# Patient Record
Sex: Female | Born: 1985 | Race: Black or African American | Hispanic: No | Marital: Single | Smoking: Never smoker
Health system: Southern US, Community
[De-identification: ages and names within clinical notes are randomized; demographics above are authoritative.]

## PROBLEM LIST (undated history)

## (undated) DIAGNOSIS — D649 Anemia, unspecified: Secondary | ICD-10-CM

## (undated) DIAGNOSIS — E559 Vitamin D deficiency, unspecified: Secondary | ICD-10-CM

## (undated) DIAGNOSIS — D219 Benign neoplasm of connective and other soft tissue, unspecified: Secondary | ICD-10-CM

## (undated) DIAGNOSIS — F419 Anxiety disorder, unspecified: Secondary | ICD-10-CM

## (undated) DIAGNOSIS — M7989 Other specified soft tissue disorders: Secondary | ICD-10-CM

## (undated) DIAGNOSIS — E739 Lactose intolerance, unspecified: Secondary | ICD-10-CM

## (undated) DIAGNOSIS — Z889 Allergy status to unspecified drugs, medicaments and biological substances status: Secondary | ICD-10-CM

## (undated) DIAGNOSIS — K219 Gastro-esophageal reflux disease without esophagitis: Secondary | ICD-10-CM

## (undated) DIAGNOSIS — K589 Irritable bowel syndrome without diarrhea: Secondary | ICD-10-CM

## (undated) HISTORY — DX: Other specified soft tissue disorders: M79.89

## (undated) HISTORY — DX: Lactose intolerance, unspecified: E73.9

## (undated) HISTORY — DX: Benign neoplasm of connective and other soft tissue, unspecified: D21.9

## (undated) HISTORY — PX: UPPER GI ENDOSCOPY: SHX6162

## (undated) HISTORY — PX: APPENDECTOMY: SHX54

## (undated) HISTORY — DX: Vitamin D deficiency, unspecified: E55.9

---

## 2010-10-30 ENCOUNTER — Emergency Department (INDEPENDENT_AMBULATORY_CARE_PROVIDER_SITE_OTHER): Payer: No Typology Code available for payment source

## 2010-10-30 ENCOUNTER — Emergency Department (HOSPITAL_BASED_OUTPATIENT_CLINIC_OR_DEPARTMENT_OTHER)
Admission: EM | Admit: 2010-10-30 | Discharge: 2010-10-30 | Disposition: A | Discharge status: Home / Self Care | Payer: No Typology Code available for payment source | Attending: Emergency Medicine | Admitting: Emergency Medicine

## 2010-10-30 DIAGNOSIS — S6000XA Contusion of unspecified finger without damage to nail, initial encounter: Secondary | ICD-10-CM | POA: Insufficient documentation

## 2010-10-30 DIAGNOSIS — I10 Essential (primary) hypertension: Secondary | ICD-10-CM | POA: Insufficient documentation

## 2010-10-30 DIAGNOSIS — R0789 Other chest pain: Secondary | ICD-10-CM

## 2010-10-30 DIAGNOSIS — S40019A Contusion of unspecified shoulder, initial encounter: Secondary | ICD-10-CM | POA: Insufficient documentation

## 2010-10-30 DIAGNOSIS — Y92410 Unspecified street and highway as the place of occurrence of the external cause: Secondary | ICD-10-CM | POA: Insufficient documentation

## 2010-10-30 DIAGNOSIS — M79609 Pain in unspecified limb: Secondary | ICD-10-CM
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
## 2017-10-26 ENCOUNTER — Other Ambulatory Visit: Payer: Self-pay | Admitting: Obstetrics and Gynecology

## 2017-11-22 DIAGNOSIS — N83209 Unspecified ovarian cyst, unspecified side: Secondary | ICD-10-CM | POA: Diagnosis present

## 2017-11-22 DIAGNOSIS — D259 Leiomyoma of uterus, unspecified: Secondary | ICD-10-CM | POA: Diagnosis present

## 2017-11-22 NOTE — H&P (Addendum)
Yolanda Meadows is a 32 y.o. female, G:0 who presents for an abdominal myomectomy because of very large fibroids and heavy menses.  The patient reports that for a year and a half,  she has noticed that she is unable to sleep on her stomach due to discomfort.  Additionally she has noticed an increase in menstrual flow with clots.  Though she only uses #2 overnight pads a day,  they are saturated when she changes them. Cramping accompanies her 7 day flow and is rated 9.5/10 on a 10 point pain scale.  Fortunately they decrease to 2/10  with #2 OTC Aleve.  She goes on to report that she has loose stools with her menstrual flow but no intermenstrual bleeding, changes in bladder function and is celibate.  An ultrasound in April 2019 revealed:  a rretroflexed uterus: 14.3 x 5.0 x 5.8 cm, endometrium-12 mm; #2 pedunculated fibroids: left lateral-11.4 cm and right fundal-18.4 cm; right ovary-3.9 cm and left ovary- with a complex cystic mass-10 cm (? dermoid).  On Oct 11, 2017 the patient was given Lupron Depot 11.25 and since that time she has had spotting and light bleeding along with severe hot flashes and headache (to see primary care physician regarding headaches).  A review of  both medical and surgical management options were given to the patient  however, given the size of her fibroids and related symptoms,  she has chosen to have them removed surgically along with the cyst on her ovary.  Past Medical History  OB History: G:0  GYN History: menarche: 32 YO    LMP: 10/25/2017    Contracepton: Celibate  Denies history of abnormal PAP smear.   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  ROS: Admits to glasses, daily headaches (attributed to Lupron-seeing PCP), chronic sinus issues, left ankle swelling due to pes planus and right wrist tendinitis;    Denies  vision changes,  dysphagia, tinnitus, dizziness, hoarseness, cough,  chest pain, shortness of breath, nausea, vomiting, diarrhea,constipation,  urinary frequency, urgency  dysuria, hematuria, vaginitis symptoms, pelvic pain, swelling of joints,easy bruising,  myalgias, arthralgias, skin rashes, unexplained weight loss and except as is mentioned in the history of present illness, patient's review of systems is otherwise negative.     Physical Exam  Bp: 102/60  P: 67 bpm  R: 16  Temperature: 97.9 degree F orally  Weight: 230 lbs.  Height: 5'3"  BMI: 40.7    O2Sat. 99%  (room air)  Neck: supple without masses or thyromegaly Lungs: clear to auscultation Heart: regular rate and rhythm Abdomen: firm mass from pelvis to level of xiphoid, non-tender and no organomegaly Pelvic:EGBUS- wnl; vagina-normal rugae; uterus-extends to level of the xiphoid at its largest dimension, cervix without lesions or motion tenderness; adnexae-no tenderness or masses Extremities:  no clubbing, cyanosis or edema   Assesment:  Large  Symptomatic Uterine Fibroids                       Large Ovarian Cyst (?  Dermoid)   Disposition:  Reviewed the risks of surgery to include, but not limited to: reaction to anesthesia, damage to adjacent organs, infection and excessive bleeding. The patient verbalized understanding of these risks and has consented to proceed with an Abdominal Myomectomy with Ovarian Cystectomy at [place] on December 07, 2017 at noon.  CSN# [id number]   [place] J. Florene Glen, PA-C  for Dr Gwynneth Munson A.  Amberle Lyter.  Date of Initial H&P: 11/22/17  History reviewed, patient examined, no change in status, stable for surgery.

## 2017-11-30 NOTE — Patient Instructions (Addendum)
Your procedure is scheduled on: Thursday, June 27  Enter through the Main Entrance of [facility] at: 10:30 am  Pick up the phone at the desk and dial [phone].  Call this number if you have problems the morning of surgery: [phone].  Remember: Do NOT eat food or Do NOT drink clear liquids (including water) after midnight Wednesday.  Take these medicines the morning of surgery with a SIP OF WATER: None.  May use flonase if needed  Stop herbal medications, vitamin supplements and Ibuprofen/NSAIDS at this time.  Do NOT wear jewelry (body piercing), metal hair clips/bobby pins, make-up, or nail polish. Do NOT wear lotions, powders, or perfumes.  You may wear deoderant. Do NOT shave for 48 hours prior to surgery. Do NOT bring valuables to the hospital.  Leave suitcase in car.  After surgery it may be brought to your room.  For patients admitted to the hospital, checkout time is 11:00 AM the day of discharge. Home with Yolanda Meadows cell [phone].

## 2017-12-06 ENCOUNTER — Other Ambulatory Visit: Payer: Self-pay

## 2017-12-06 ENCOUNTER — Encounter (HOSPITAL_COMMUNITY): Payer: Self-pay

## 2017-12-06 ENCOUNTER — Encounter (HOSPITAL_COMMUNITY)
Admission: RE | Admit: 2017-12-06 | Discharge: 2017-12-06 | Disposition: A | Discharge status: Home / Self Care | Payer: BC Managed Care – PPO | Source: Ambulatory Visit | Attending: Obstetrics and Gynecology | Admitting: Obstetrics and Gynecology

## 2017-12-06 DIAGNOSIS — Z01812 Encounter for preprocedural laboratory examination: Secondary | ICD-10-CM

## 2017-12-06 HISTORY — DX: Irritable bowel syndrome, unspecified: K58.9

## 2017-12-06 HISTORY — DX: Gastro-esophageal reflux disease without esophagitis: K21.9

## 2017-12-06 HISTORY — DX: Allergy status to unspecified drugs, medicaments and biological substances: Z88.9

## 2017-12-06 HISTORY — DX: Anxiety disorder, unspecified: F41.9

## 2017-12-06 HISTORY — DX: Anemia, unspecified: D64.9

## 2017-12-06 LAB — CBC
HCT: 43.6 % (ref 36.0–46.0)
Hemoglobin: 14.1 g/dL (ref 12.0–15.0)
MCH: 26.4 pg (ref 26.0–34.0)
MCHC: 32.3 g/dL (ref 30.0–36.0)
MCV: 81.5 fL (ref 78.0–100.0)
PLATELETS: 260 10*3/uL (ref 150–400)
RBC: 5.35 MIL/uL — AB (ref 3.87–5.11)
RDW: 13.2 % (ref 11.5–15.5)
WBC: 3.7 10*3/uL — ABNORMAL LOW (ref 4.0–10.5)

## 2017-12-06 LAB — BASIC METABOLIC PANEL
Anion gap: 7 (ref 5–15)
BUN: 16 mg/dL (ref 6–20)
CO2: 27 mmol/L (ref 22–32)
CREATININE: 1.09 mg/dL — AB (ref 0.44–1.00)
Calcium: 9.3 mg/dL (ref 8.9–10.3)
Chloride: 103 mmol/L (ref 98–111)
Glucose, Bld: 84 mg/dL (ref 70–99)
Potassium: 5 mmol/L (ref 3.5–5.1)
Sodium: 137 mmol/L (ref 135–145)

## 2017-12-06 LAB — TYPE AND SCREEN
ABO/RH(D): A POS
Antibody Screen: NEGATIVE

## 2017-12-06 LAB — ABO/RH: ABO/RH(D): A POS

## 2017-12-07 ENCOUNTER — Encounter (HOSPITAL_COMMUNITY)
Admission: AD | Disposition: A | Discharge status: Home / Self Care | Payer: Self-pay | Source: Home / Self Care | Attending: Obstetrics and Gynecology

## 2017-12-07 ENCOUNTER — Inpatient Hospital Stay (HOSPITAL_COMMUNITY): Payer: BC Managed Care – PPO | Admitting: Certified Registered Nurse Anesthetist

## 2017-12-07 ENCOUNTER — Encounter (HOSPITAL_COMMUNITY): Payer: Self-pay | Admitting: Anesthesiology

## 2017-12-07 ENCOUNTER — Other Ambulatory Visit: Payer: Self-pay

## 2017-12-07 ENCOUNTER — Inpatient Hospital Stay (HOSPITAL_COMMUNITY)
Admission: AD | Admit: 2017-12-07 | Discharge: 2017-12-09 | DRG: 742 | Disposition: A | Discharge status: Home / Self Care | Payer: BC Managed Care – PPO | Attending: Obstetrics and Gynecology | Admitting: Obstetrics and Gynecology

## 2017-12-07 DIAGNOSIS — D259 Leiomyoma of uterus, unspecified: Secondary | ICD-10-CM | POA: Diagnosis present

## 2017-12-07 DIAGNOSIS — Z6841 Body Mass Index (BMI) 40.0 and over, adult: Secondary | ICD-10-CM

## 2017-12-07 DIAGNOSIS — L918 Other hypertrophic disorders of the skin: Secondary | ICD-10-CM | POA: Diagnosis present

## 2017-12-07 DIAGNOSIS — D271 Benign neoplasm of left ovary: Secondary | ICD-10-CM | POA: Diagnosis present

## 2017-12-07 DIAGNOSIS — N83209 Unspecified ovarian cyst, unspecified side: Secondary | ICD-10-CM | POA: Diagnosis present

## 2017-12-07 DIAGNOSIS — D219 Benign neoplasm of connective and other soft tissue, unspecified: Secondary | ICD-10-CM | POA: Diagnosis present

## 2017-12-07 HISTORY — PX: EXCISION OF SKIN TAG: SHX6270

## 2017-12-07 HISTORY — PX: OVARIAN CYST REMOVAL: SHX89

## 2017-12-07 HISTORY — PX: MYOMECTOMY: SHX85

## 2017-12-07 LAB — PREGNANCY, URINE: PREG TEST UR: NEGATIVE

## 2017-12-07 SURGERY — MYOMECTOMY, ABDOMINAL APPROACH
Anesthesia: General | Site: Abdomen

## 2017-12-07 MED ORDER — LACTATED RINGERS IV SOLN
INTRAVENOUS | Status: DC
  Administered 2017-12-07: 13:00:00 via INTRAVENOUS
  Administered 2017-12-07: 125 mL/h via INTRAVENOUS

## 2017-12-07 MED ORDER — DOCUSATE SODIUM 100 MG PO CAPS
100.0000 mg | ORAL_CAPSULE | Freq: Two times a day (BID) | ORAL | Status: DC
  Administered 2017-12-07 – 2017-12-09 (×4): 100 mg via ORAL
  Filled 2017-12-07 (×4): qty 1

## 2017-12-07 MED ORDER — CEFAZOLIN SODIUM-DEXTROSE 2-4 GM/100ML-% IV SOLN
INTRAVENOUS | Status: AC
  Filled 2017-12-07: qty 100

## 2017-12-07 MED ORDER — HYDROMORPHONE HCL 1 MG/ML IJ SOLN
0.2500 mg | INTRAMUSCULAR | Status: DC | PRN
  Administered 2017-12-07 (×4): 0.5 mg via INTRAVENOUS

## 2017-12-07 MED ORDER — MIDAZOLAM HCL 5 MG/5ML IJ SOLN
INTRAMUSCULAR | Status: DC | PRN
  Administered 2017-12-07: 2 mg via INTRAVENOUS

## 2017-12-07 MED ORDER — SUGAMMADEX SODIUM 200 MG/2ML IV SOLN
INTRAVENOUS | Status: DC | PRN
  Administered 2017-12-07: 200 mg via INTRAVENOUS

## 2017-12-07 MED ORDER — 0.9 % SODIUM CHLORIDE (POUR BTL) OPTIME
TOPICAL | Status: DC | PRN
  Administered 2017-12-07 (×5): 1000 mL

## 2017-12-07 MED ORDER — SODIUM CHLORIDE 0.9 % IJ SOLN
INTRAMUSCULAR | Status: AC
  Filled 2017-12-07: qty 100

## 2017-12-07 MED ORDER — NALOXONE HCL 0.4 MG/ML IJ SOLN: 0.4000 mg | INTRAMUSCULAR | Status: DC | PRN

## 2017-12-07 MED ORDER — MIDAZOLAM HCL 2 MG/2ML IJ SOLN
INTRAMUSCULAR | Status: AC
  Filled 2017-12-07: qty 2

## 2017-12-07 MED ORDER — HYDROMORPHONE HCL 1 MG/ML IJ SOLN
INTRAMUSCULAR | Status: AC
  Filled 2017-12-07: qty 1

## 2017-12-07 MED ORDER — HYDROMORPHONE 1 MG/ML IV SOLN
INTRAVENOUS | Status: DC
  Administered 2017-12-07: 17:00:00 via INTRAVENOUS
  Administered 2017-12-07: 3.9 mg via INTRAVENOUS
  Administered 2017-12-08: 1.5 mg via INTRAVENOUS
  Administered 2017-12-08 (×2): 0.9 mg via INTRAVENOUS
  Filled 2017-12-07: qty 25

## 2017-12-07 MED ORDER — DIPHENHYDRAMINE HCL 12.5 MG/5ML PO ELIX: 12.5000 mg | ORAL_SOLUTION | Freq: Four times a day (QID) | ORAL | Status: DC | PRN

## 2017-12-07 MED ORDER — KETOROLAC TROMETHAMINE 30 MG/ML IJ SOLN
INTRAMUSCULAR | Status: DC | PRN
  Administered 2017-12-07: 30 mg via INTRAVENOUS

## 2017-12-07 MED ORDER — DIPHENHYDRAMINE HCL 50 MG/ML IJ SOLN: 12.5000 mg | Freq: Four times a day (QID) | INTRAMUSCULAR | Status: DC | PRN

## 2017-12-07 MED ORDER — SCOPOLAMINE 1 MG/3DAYS TD PT72
1.0000 | MEDICATED_PATCH | Freq: Once | TRANSDERMAL | Status: DC
  Administered 2017-12-07: 1.5 mg via TRANSDERMAL

## 2017-12-07 MED ORDER — FENTANYL CITRATE (PF) 250 MCG/5ML IJ SOLN
INTRAMUSCULAR | Status: AC
  Filled 2017-12-07: qty 5

## 2017-12-07 MED ORDER — MENTHOL 3 MG MT LOZG
1.0000 | LOZENGE | OROMUCOSAL | Status: DC | PRN
Start: 2017-12-07 — End: 2017-12-09

## 2017-12-07 MED ORDER — CEFAZOLIN SODIUM-DEXTROSE 2-4 GM/100ML-% IV SOLN
2.0000 g | INTRAVENOUS | Status: AC
  Administered 2017-12-07: 2 g via INTRAVENOUS

## 2017-12-07 MED ORDER — DEXAMETHASONE SODIUM PHOSPHATE 4 MG/ML IJ SOLN
INTRAMUSCULAR | Status: DC | PRN
  Administered 2017-12-07: 4 mg via INTRAVENOUS

## 2017-12-07 MED ORDER — OXYCODONE-ACETAMINOPHEN 5-325 MG PO TABS
1.0000 | ORAL_TABLET | Freq: Four times a day (QID) | ORAL | Status: DC | PRN
  Administered 2017-12-08 (×2): 1 via ORAL
  Filled 2017-12-07: qty 2
  Filled 2017-12-07 (×2): qty 1

## 2017-12-07 MED ORDER — ROCURONIUM BROMIDE 100 MG/10ML IV SOLN
INTRAVENOUS | Status: DC | PRN
  Administered 2017-12-07: 50 mg via INTRAVENOUS
  Administered 2017-12-07 (×2): 10 mg via INTRAVENOUS

## 2017-12-07 MED ORDER — PROPOFOL 10 MG/ML IV BOLUS
INTRAVENOUS | Status: DC | PRN
  Administered 2017-12-07: 200 mg via INTRAVENOUS
  Administered 2017-12-07: 10 mg via INTRAVENOUS

## 2017-12-07 MED ORDER — IBUPROFEN 600 MG PO TABS: 600.0000 mg | ORAL_TABLET | Freq: Four times a day (QID) | ORAL | Status: DC | PRN

## 2017-12-07 MED ORDER — ONDANSETRON HCL 4 MG PO TABS: 4.0000 mg | ORAL_TABLET | Freq: Three times a day (TID) | ORAL | Status: DC | PRN

## 2017-12-07 MED ORDER — FENTANYL CITRATE (PF) 100 MCG/2ML IJ SOLN
INTRAMUSCULAR | Status: DC | PRN
  Administered 2017-12-07 (×3): 50 ug via INTRAVENOUS
  Administered 2017-12-07 (×2): 100 ug via INTRAVENOUS
  Administered 2017-12-07: 50 ug via INTRAVENOUS

## 2017-12-07 MED ORDER — FENTANYL CITRATE (PF) 100 MCG/2ML IJ SOLN
INTRAMUSCULAR | Status: AC
  Filled 2017-12-07: qty 2

## 2017-12-07 MED ORDER — SODIUM CHLORIDE 0.9% FLUSH: 9.0000 mL | INTRAVENOUS | Status: DC | PRN

## 2017-12-07 MED ORDER — LACTATED RINGERS IV SOLN
INTRAVENOUS | Status: DC
  Administered 2017-12-07 – 2017-12-08 (×2): via INTRAVENOUS

## 2017-12-07 MED ORDER — FERROUS SULFATE 325 (65 FE) MG PO TABS
325.0000 mg | ORAL_TABLET | Freq: Every day | ORAL | Status: DC
  Administered 2017-12-07 – 2017-12-08 (×2): 325 mg via ORAL
  Filled 2017-12-07 (×2): qty 1

## 2017-12-07 MED ORDER — ONDANSETRON HCL 4 MG/2ML IJ SOLN
INTRAMUSCULAR | Status: DC | PRN
  Administered 2017-12-07: 4 mg via INTRAVENOUS

## 2017-12-07 MED ORDER — ONDANSETRON HCL 4 MG/2ML IJ SOLN: 4.0000 mg | Freq: Four times a day (QID) | INTRAMUSCULAR | Status: DC | PRN

## 2017-12-07 MED ORDER — LIDOCAINE HCL (CARDIAC) PF 100 MG/5ML IV SOSY
PREFILLED_SYRINGE | INTRAVENOUS | Status: DC | PRN
  Administered 2017-12-07: 100 mg via INTRAVENOUS

## 2017-12-07 MED ORDER — VASOPRESSIN 20 UNIT/ML IV SOLN
INTRAVENOUS | Status: AC
  Filled 2017-12-07: qty 1

## 2017-12-07 MED ORDER — SCOPOLAMINE 1 MG/3DAYS TD PT72
MEDICATED_PATCH | TRANSDERMAL | Status: AC
  Administered 2017-12-07: 1.5 mg via TRANSDERMAL
  Filled 2017-12-07: qty 1

## 2017-12-07 MED ORDER — KETOROLAC TROMETHAMINE 30 MG/ML IJ SOLN
30.0000 mg | Freq: Four times a day (QID) | INTRAMUSCULAR | Status: DC
Start: 2017-12-07 — End: 2017-12-08
  Administered 2017-12-07 – 2017-12-08 (×4): 30 mg via INTRAVENOUS
  Filled 2017-12-07 (×4): qty 1

## 2017-12-07 SURGICAL SUPPLY — 40 items
BARRIER ADHS 3X4 INTERCEED (GAUZE/BANDAGES/DRESSINGS) IMPLANT
CANISTER SUCT 3000ML PPV (MISCELLANEOUS) ×5 IMPLANT
CONT PATH 16OZ SNAP LID 3702 (MISCELLANEOUS) ×5 IMPLANT
DISSECTOR ROUND CHERRY 3/8 STR (MISCELLANEOUS) ×5 IMPLANT
DRAPE CESAREAN BIRTH W POUCH (DRAPES) ×5 IMPLANT
GAUZE SPONGE 4X4 16PLY XRAY LF (GAUZE/BANDAGES/DRESSINGS) ×10 IMPLANT
GLOVE BIO SURGEON STRL SZ 6.5 (GLOVE) ×4 IMPLANT
GLOVE BIO SURGEONS STRL SZ 6.5 (GLOVE) ×1
GLOVE BIOGEL PI IND STRL 7.0 (GLOVE) ×6 IMPLANT
GLOVE BIOGEL PI INDICATOR 7.0 (GLOVE) ×4
GOWN STRL REUS W/TWL LRG LVL3 (GOWN DISPOSABLE) ×10 IMPLANT
HEMOSTAT SURGICEL 2X14 (HEMOSTASIS) IMPLANT
NEEDLE HYPO 22GX1.5 SAFETY (NEEDLE) ×10 IMPLANT
NS IRRIG 1000ML POUR BTL (IV SOLUTION) ×5 IMPLANT
PACK ABDOMINAL GYN (CUSTOM PROCEDURE TRAY) ×5 IMPLANT
PAD OB MATERNITY 4.3X12.25 (PERSONAL CARE ITEMS) ×5 IMPLANT
PENCIL SMOKE EVAC W/HOLSTER (ELECTROSURGICAL) ×5 IMPLANT
PROTECTOR NERVE ULNAR (MISCELLANEOUS) ×5 IMPLANT
SPONGE LAP 18X18 RF (DISPOSABLE) ×20 IMPLANT
SPONGE SURGIFOAM ABS GEL 12-7 (HEMOSTASIS) ×5 IMPLANT
STAPLER VISISTAT 35W (STAPLE) ×5 IMPLANT
SUT CHROMIC 0 CT 1 (SUTURE) ×5 IMPLANT
SUT CHROMIC 3 0 SH 27 (SUTURE) ×5 IMPLANT
SUT MNCRL AB 3-0 PS2 27 (SUTURE) IMPLANT
SUT PDS AB 0 CT 36 (SUTURE) ×5 IMPLANT
SUT PLAIN 2 0 XLH (SUTURE) ×10 IMPLANT
SUT VIC AB 0 CT1 18XCR BRD8 (SUTURE) ×9 IMPLANT
SUT VIC AB 0 CT1 36 (SUTURE) ×10 IMPLANT
SUT VIC AB 0 CT1 8-18 (SUTURE) ×6
SUT VIC AB 2-0 SH 27 (SUTURE) ×6
SUT VIC AB 2-0 SH 27XBRD (SUTURE) ×9 IMPLANT
SUT VIC AB 4-0 SH 27 (SUTURE) ×2
SUT VIC AB 4-0 SH 27XANBCTRL (SUTURE) ×3 IMPLANT
SUT VICRYL 0 TIES 12 18 (SUTURE) ×5 IMPLANT
SUT VICRYL 0 UR6 27IN ABS (SUTURE) ×5 IMPLANT
SUT VICRYL 2 0 18  UND BR (SUTURE)
SUT VICRYL 2 0 18 UND BR (SUTURE) IMPLANT
SYR CONTROL 10ML LL (SYRINGE) ×10 IMPLANT
TOWEL OR 17X24 6PK STRL BLUE (TOWEL DISPOSABLE) ×10 IMPLANT
TRAY FOLEY W/BAG SLVR 14FR (SET/KITS/TRAYS/PACK) ×5 IMPLANT

## 2017-12-07 NOTE — Anesthesia Postprocedure Evaluation (Signed)
Anesthesia Post Note  Patient: Yolanda Meadows  Procedure(s) Performed: ABDOMINAL MYOMECTOMY (N/A ) EXCISION OF ABDOMINAL SKIN TAG (N/A Abdomen) OVARIAN CYSTECTOMY (Left )     Patient location during evaluation: PACU Anesthesia Type: General Level of consciousness: awake and alert and oriented Pain management: pain level controlled Vital Signs Assessment: post-procedure vital signs reviewed and stable Respiratory status: spontaneous breathing, nonlabored ventilation and respiratory function stable Cardiovascular status: blood pressure returned to baseline and stable Postop Assessment: no apparent nausea or vomiting Anesthetic complications: no    Last Vitals:  Vitals:   12/07/17 1651 12/07/17 1658  BP: 121/68   Pulse: 91   Resp: 16 13  Temp: 36.9 C   SpO2: 100% 100%    Last Pain:  Vitals:   12/07/17 1658  TempSrc:   PainSc: 5    Pain Goal: Patients Stated Pain Goal: 4 (12/07/17 1625)               Ronneisha Jett A.

## 2017-12-07 NOTE — Transfer of Care (Signed)
Immediate Anesthesia Transfer of Care Note  Patient: Yolanda Meadows  Procedure(s) Performed: ABDOMINAL MYOMECTOMY (N/A ) EXCISION OF ABDOMINAL SKIN TAG (N/A Abdomen) OVARIAN CYSTECTOMY (Left )  Patient Location: PACU  Anesthesia Type:General  Level of Consciousness: awake, alert  and oriented  Airway & Oxygen Therapy: Patient Spontanous Breathing and Patient connected to nasal cannula oxygen  Post-op Assessment: Report given to RN and Post -op Vital signs reviewed and stable  Post vital signs: Reviewed and stable  Last Vitals:  Vitals Value Taken Time  BP 144/71 12/07/2017  3:41 PM  Temp    Pulse 97 12/07/2017  3:43 PM  Resp 9 12/07/2017  3:43 PM  SpO2 100 % 12/07/2017  3:43 PM  Vitals shown include unvalidated device data.  Last Pain:  Vitals:   12/07/17 1046  TempSrc: Oral  PainSc: 0-No pain      Patients Stated Pain Goal: 4 (20/25/42 7062)  Complications: No apparent anesthesia complications

## 2017-12-07 NOTE — Anesthesia Preprocedure Evaluation (Signed)
Anesthesia Evaluation  Patient identified by MRN, date of birth, ID band Patient awake    Reviewed: Allergy & Precautions, NPO status , Patient's Chart, lab work & pertinent test results  Airway Mallampati: II  TM Distance: >3 FB     Dental no notable dental hx. (+) Teeth Intact   Pulmonary neg pulmonary ROS,    Pulmonary exam normal breath sounds clear to auscultation       Cardiovascular negative cardio ROS Normal cardiovascular exam Rhythm:Regular Rate:Normal     Neuro/Psych negative neurological ROS     GI/Hepatic Neg liver ROS, GERD  ,Diet controlled   Endo/Other  Morbid obesity  Renal/GU Renal InsufficiencyRenal disease  negative genitourinary   Musculoskeletal negative musculoskeletal ROS (+)   Abdominal (+) + obese,   Peds  Hematology  (+) anemia ,   Anesthesia Other Findings   Reproductive/Obstetrics Uterine fibroids Ovarian cyst                             Anesthesia Physical Anesthesia Plan  ASA: III  Anesthesia Plan: General   Post-op Pain Management:    Induction: Intravenous  PONV Risk Score and Plan: Scopolamine patch - Pre-op, Midazolam, Dexamethasone, Ondansetron and Treatment may vary due to age or medical condition  Airway Management Planned: Oral ETT  Additional Equipment:   Intra-op Plan:   Post-operative Plan: Extubation in OR  Informed Consent: I have reviewed the patients History and Physical, chart, labs and discussed the procedure including the risks, benefits and alternatives for the proposed anesthesia with the patient or authorized representative who has indicated his/her understanding and acceptance.   Dental advisory given  Plan Discussed with: CRNA and Surgeon  Anesthesia Plan Comments:         Anesthesia Quick Evaluation

## 2017-12-07 NOTE — Progress Notes (Signed)
Day of Surgery Procedure(s) (LRB): ABDOMINAL MYOMECTOMY (N/A) EXCISION OF ABDOMINAL SKIN TAG (N/A) OVARIAN CYSTECTOMY (Left)  Subjective: Patient reports tolerating PO.    Objective: I have reviewed patient's vital signs, intake and output and medications.  General: alert and cooperative Resp: clear to auscultation bilaterally GI: soft, non-tender; bowel sounds normal; no masses,  no organomegaly Extremities: Homans sign is negative, no sign of DVT Vaginal Bleeding: minimal  Assessment: s/p Procedure(s) with comments: ABDOMINAL MYOMECTOMY (N/A) EXCISION OF ABDOMINAL SKIN TAG (N/A) OVARIAN CYSTECTOMY (Left) - LEFT OVARIAN DERMOID CYST: stable and progressing well  Plan: Advance diet Encourage ambulation  LOS: 0 days    [place] A 12/07/2017, 10:41 PM

## 2017-12-07 NOTE — Op Note (Signed)
Preoperative diagnosis: Symptomatic fibroids  2.   dermoid cyst  Postop diagnosis is the same with pelvic adhesions and blunted fallopian tubes  Procedure: Abdominal laparotomy lysis of adhesions uterine myomectomy and left ovarian cystectomy .  Removal of skin tag  Surgeon: Dr. Charlesetta Garibaldi  Asst.: Earnstine Regal PA  Estimated blood loss: 450 cc  Urine output: 425 cc clear urine  IV fluids: 2200 cc crystalloid  Complications: None  Anesthesia: Gen.  Procedure in detail:Patient was taken to the operating room placed in dorsal supine position and prepped and draped in a normal sterile fashion. The patient was examined and noted to have the uterine fibroids extending about 5 cm above the umbilicus. A timeout was done.  Small skin tag was removed using the knife in the pts old appendectomy incision  A vertical incision was made 2 cm above the sepsis pubis to about a centimeter below the umbilicus with a knife. Incision was carried out to the fascia using Bovie cautery. Bovie cautery was used to incise the fascia. This incision was extended superiorly and inferiorly using Mayo scissors. The fascia was grasped with Coker clamps and extended off of the rectus muscle and peritoneum. The peritoneum was opened with Metzenbaum scissors and extended superiorly inferiorly with good visualization of bowel and bladder. The uterus had to be mobilized outside of the abdominal incision using towel clamps in pulling. Several large pedunculated fibroids had performed a huge mass. Once I identified the uterus underneath the fibroids noted the tubes and ovaries 2 Kelly clamps were placed above the uterus but below the fibroids. using Mayo scissors the fibroids were cut away.  The specimen was handed and sent to pathology. The uterine cavity had not been entered.  The uterus was made hemostatic with figure  eight 0 Vicryl suture.patient was done and the uterus was seen to be hemostatic.  There was some lesions of the  large bowel to the right pelvic sidewall. These were lysed using Metzenbaum scissors. A Balfour retractor was then placed into the abdomen. Sidewalls were noted to be free. The bowels were packed away with moist laparotomy sponges. The patient's left fallopian tube was adherent to the left ovary. Using both sharp and blunt dissection the tube was removed from the left ovary. The the tube was wanted. In the fimbria were plastered all along the ovary on the left-hand side.  The right fallopian tube was blunted at the fimbria. The right ovary appeared normal.  Once left fallopian tube was dissected off of the left ovary and incision was made in the ovary. 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 The contents of the ovary was removed and sent to pathology. Cyst wall was also removed and sent to pathology. The hilum of the ovary was made hemostatic using suture and Gelfoam. The ovary was closed using 2-0 Vicryl in a running fashion. Irrigation was done and noted to be hemostatic. Attention was then turned again to the uterus. Also some bleeding noted at the fundus of the uterus which was made hemostatic with figure-of-eight suture. Irrigation was once again done.  Hemostasis was noted sponge lap and needle counts were correct the Balfour retractor and sponges were removed from the abdomen. The peritoneum was closed with 0 chromic. The fascia was closed both superiorly and inferiorly with 0 Vicryl in a running fashion. Cutaneous tissue was reapproximated using  20 plain.the skin was closed with staples.

## 2017-12-07 NOTE — Anesthesia Procedure Notes (Signed)
Procedure Name: Intubation Date/Time: 12/07/2017 1:09 PM Performed by: Derril Franek R, CRNA Pre-anesthesia Checklist: Patient identified, Emergency Drugs available, Suction available and Patient being monitored Patient Re-evaluated:Patient Re-evaluated prior to induction Oxygen Delivery Method: Circle system utilized Preoxygenation: Pre-oxygenation with 100% oxygen Induction Type: IV induction Ventilation: Mask ventilation without difficulty Laryngoscope Size: Mac and 3 Grade View: Grade II Tube type: Oral Tube size: 7.0 mm Number of attempts: 1 Airway Equipment and Method: Stylet Placement Confirmation: ETT inserted through vocal cords under direct vision,  positive ETCO2 and breath sounds checked- equal and bilateral Secured at: 21 (right lip) cm Tube secured with: Tape Dental Injury: Teeth and Oropharynx as per pre-operative assessment        

## 2017-12-07 NOTE — Anesthesia Procedure Notes (Signed)
Procedure Name: Intubation Date/Time: 12/07/2017 1:09 PM Performed by: Alvy Bimler, CRNA Pre-anesthesia Checklist: Patient identified, Emergency Drugs available, Suction available and Patient being monitored Patient Re-evaluated:Patient Re-evaluated prior to induction Oxygen Delivery Method: Circle system utilized Preoxygenation: Pre-oxygenation with 100% oxygen Induction Type: IV induction Ventilation: Mask ventilation without difficulty Laryngoscope Size: Mac and 3 Grade View: Grade II Tube type: Oral Tube size: 7.0 mm Number of attempts: 1 Airway Equipment and Method: Stylet Placement Confirmation: ETT inserted through vocal cords under direct vision,  positive ETCO2 and breath sounds checked- equal and bilateral Secured at: 21 (right lip) cm Tube secured with: Tape Dental Injury: Teeth and Oropharynx as per pre-operative assessment

## 2017-12-08 ENCOUNTER — Encounter (HOSPITAL_COMMUNITY): Payer: Self-pay | Admitting: Obstetrics and Gynecology

## 2017-12-08 LAB — BASIC METABOLIC PANEL
ANION GAP: 7 (ref 5–15)
BUN: 12 mg/dL (ref 6–20)
CALCIUM: 8.5 mg/dL — AB (ref 8.9–10.3)
CO2: 26 mmol/L (ref 22–32)
CREATININE: 1.05 mg/dL — AB (ref 0.44–1.00)
Chloride: 102 mmol/L (ref 98–111)
GFR calc Af Amer: >60 mL/min (ref >60)
GFR calc non Af Amer: >60 mL/min (ref >60)
Glucose, Bld: 100 mg/dL — ABNORMAL HIGH (ref 70–99)
Potassium: 4.4 mmol/L (ref 3.5–5.1)
Sodium: 135 mmol/L (ref 135–145)

## 2017-12-08 LAB — CBC
HEMATOCRIT: 34 % — AB (ref 36.0–46.0)
Hemoglobin: 11.1 g/dL — ABNORMAL LOW (ref 12.0–15.0)
MCH: 26.7 pg (ref 26.0–34.0)
MCHC: 32.6 g/dL (ref 30.0–36.0)
MCV: 81.9 fL (ref 78.0–100.0)
Platelets: 219 10*3/uL (ref 150–400)
RBC: 4.15 MIL/uL (ref 3.87–5.11)
RDW: 13.3 % (ref 11.5–15.5)
WBC: 7.8 10*3/uL (ref 4.0–10.5)

## 2017-12-08 MED ORDER — IBUPROFEN 600 MG PO TABS
600.0000 mg | ORAL_TABLET | Freq: Four times a day (QID) | ORAL | Status: DC
  Administered 2017-12-08 – 2017-12-09 (×3): 600 mg via ORAL
  Filled 2017-12-08 (×3): qty 1

## 2017-12-08 MED ORDER — OXYCODONE-ACETAMINOPHEN 5-325 MG PO TABS
1.0000 | ORAL_TABLET | ORAL | Status: DC | PRN
  Administered 2017-12-08 – 2017-12-09 (×3): 2 via ORAL
  Filled 2017-12-08 (×2): qty 2

## 2017-12-08 NOTE — Plan of Care (Signed)
Patient is alert and oriented x4. She denies any pain or discomfort at this time. She has ambulated from her bed to the bathroom without any difficulty.

## 2017-12-08 NOTE — Anesthesia Postprocedure Evaluation (Signed)
Anesthesia Post Note  Patient: Yolanda Meadows  Procedure(s) Performed: ABDOMINAL MYOMECTOMY (N/A ) EXCISION OF ABDOMINAL SKIN TAG (N/A Abdomen) OVARIAN CYSTECTOMY (Left )     Patient location during evaluation: [facility] Anesthesia Type: General Level of consciousness: awake, awake and alert and oriented Pain management: pain level controlled Vital Signs Assessment: post-procedure vital signs reviewed and stable Respiratory status: spontaneous breathing, nonlabored ventilation and respiratory function stable Cardiovascular status: stable Postop Assessment: no headache, no backache, patient able to bend at knees, no apparent nausea or vomiting, adequate PO intake and able to ambulate Anesthetic complications: no    Last Vitals:  Vitals:   12/08/17 0602 12/08/17 0733  BP:  (!) 110/55  Pulse:  64  Resp: 16 17  Temp:  36.6 C  SpO2: 99% 100%    Last Pain:  Vitals:   12/08/17 0915  TempSrc:   PainSc: 2    Pain Goal: Patients Stated Pain Goal: 3 (12/07/17 2150)               Bernell Haynie

## 2017-12-08 NOTE — Progress Notes (Signed)
1 Day Post-Op Procedure(s) (LRB): ABDOMINAL MYOMECTOMY (N/A) EXCISION OF ABDOMINAL SKIN TAG (N/A) OVARIAN CYSTECTOMY (Left)  Subjective: Patient reports tolerating PO, + BM and no problems voiding.   BP (!) 119/59 (BP Location: Right Arm)   Pulse 64   Temp 98.9 F (37.2 C) (Oral)   Resp 18   Ht 5' 3.5" (1.613 m)   Wt 104.3 kg (230 lb)   SpO2 99%   BMI 40.10 kg/m    Objective: I have reviewed patient's vital signs, intake and output, medications and labs.  General: alert and cooperative Cardio: regular rate and rhythm GI: soft, non-tender; bowel sounds normal; no masses,  no organomegaly and incision: clean, dry and intact Extremities: Homans sign is negative, no sign of DVT  Assessment: s/p Procedure(s) with comments: ABDOMINAL MYOMECTOMY (N/A) EXCISION OF ABDOMINAL SKIN TAG (N/A) OVARIAN CYSTECTOMY (Left) - LEFT OVARIAN DERMOID CYST: stable, progressing well and tolerating diet  Plan: Encourage ambulation Advance to PO medication  LOS: 1 day    Braileigh Landenberger A 12/08/2017, 4:02 PM

## 2017-12-08 NOTE — Progress Notes (Signed)
Yolanda Meadows is a56 y.o.  [id number]  Post Op Date # Abdominal Myomectomy/ Left Ovarian Cystectomy (Dermoid)/Right Abdominal Skin Tag Removal  Subjective: Patient is Doing well postoperatively. Patient has Pain is controlled with current analgesics. Medications being used: prescription NSAID's including Ketorolac 30 mg IV and narcotic analgesics including PCA Hydromorphone.. Ambulating in the halls without dizziness, tolerating liquids and has passed flatus.  Has not voided yet.   Objective: Vital signs in last 24 hours: Temp:  [98.2 F (36.8 C)-99.2 F (37.3 C)] 98.3 F (36.8 C) (06/28 0513) Pulse Rate:  [60-101] 73 (06/28 0513) Resp:  [10-17] 16 (06/28 0602) BP: (95-144)/(50-79) 105/62 (06/28 0513) SpO2:  [99 %-100 %] 99 % (06/28 0602) Weight:  [230 lb (104.3 kg)] 230 lb (104.3 kg) (06/27 1700)  Intake/Output from previous day: 06/27 0701 - 06/28 0700 In: 2945 [P.O.:120; I.V.:2825] Out: 2775 [Urine:2325] Intake/Output this shift: Total I/O In: -  Out: 1750 [Urine:1750] Recent Labs  Lab 12/06/17 1455  WBC 3.7*  HGB 14.1  HCT 43.6  PLT 260     Recent Labs  Lab 12/06/17 1455 12/08/17 0548  NA 137 135  K 5.0 4.4  CL 103 102  CO2 27 26  BUN 16 12  CREATININE 1.09* 1.05*  CALCIUM 9.3 8.5*  GLUCOSE 84 100*    EXAM: General: alert, cooperative and no distress Resp: clear to auscultation bilaterally Cardio: regular rate and rhythm, S1, S2 normal, no murmur, click, rub or gallop GI: Bowel sounds present,  honeycomb dressing is clean/dry/intact. Extremities: Homans sign is negative, no sign of DVT and SCD hose in place and functioning;  no calf tenderness. Vaginal Bleeding: none   Assessment: s/p Procedure(s): ABDOMINAL MYOMECTOMY EXCISION OF ABDOMINAL SKIN TAG OVARIAN CYSTECTOMY: stable and progressing well  Plan: Advance diet Encourage ambulation Advance to PO medication CBC-pending  Discharge home on tomorrow  LOS: 1 day    Earnstine Regal, PA-C  12/08/2017 7:00 AM

## 2017-12-08 NOTE — Plan of Care (Signed)
Pt. Condition will continue to improve 

## 2017-12-09 MED ORDER — DOCUSATE SODIUM 100 MG PO CAPS: 100.0000 mg | ORAL_CAPSULE | Freq: Two times a day (BID) | ORAL | 0 refills | Status: AC

## 2017-12-09 MED ORDER — ONDANSETRON HCL 4 MG PO TABS: 4.0000 mg | ORAL_TABLET | Freq: Three times a day (TID) | ORAL | 0 refills | Status: AC | PRN

## 2017-12-09 MED ORDER — IBUPROFEN 600 MG PO TABS: 600.0000 mg | ORAL_TABLET | Freq: Four times a day (QID) | ORAL | 0 refills | Status: AC

## 2017-12-09 MED ORDER — OXYCODONE-ACETAMINOPHEN 5-325 MG PO TABS: 1.0000 | ORAL_TABLET | ORAL | 0 refills | Status: AC | PRN

## 2017-12-09 NOTE — Discharge Instructions (Signed)
Myomectomy, Care After Refer to this sheet in the next few weeks. These instructions provide you with information on caring for yourself after your procedure. Your health care provider may also give you more specific instructions. Your treatment has been planned according to current medical practices, but problems sometimes occur. Call your health care provider if you have any problems or questions after your procedure. What can I expect after the procedure? After your procedure, it is typical to have the following:  Pain in your abdomen, especially at any incision sites. You will be given pain medicine to control the pain.  Tiredness. This is a normal part of the recovery process. Your energy level will return to normal over the next several weeks.  Constipation.  Vaginal bleeding. This is normal and should stop after 1-2 weeks.  Follow these instructions at home:  Only take over-the-counter or prescription medicines as directed by your health care provider. Avoid aspirin because it can cause bleeding.  Do not douche, use tampons, or have sexual intercourse until given permission by your health care provider.  Remove or change any bandages (dressings) as directed by your health care provider.  Take showers instead of baths as directed by your health care provider.  You will probably be able to go back to your normal routine after a few days. Do not do anything that requires extra effort until your health care provider says it is okay. Do not lift anything heavier than 15 pounds (6.8 kg) until your health care provider approves.  Walk daily but take frequent rest breaks if you tire easily.  Continue to practice deep breathing and coughing. If it hurts to cough, try holding a pillow against your belly as you cough.  If you become constipated, you may: ? Use a mild laxative if your health care provider approves. ? Add more fruit and bran to your diet. ? Drink enough fluids to keep your  urine clear or pale yellow.  Take your temperature twice a day and write it down.  Do not drink alcohol.  Do not drive until your health care provider approves.  Have someone help you at home for 1 week or until you can do your own household activities.  Follow up with your health care provider as directed. Contact a health care provider if:  You have a fever.  You have increasing abdominal pain that is not relieved with medicine.  You have nausea, vomiting, or diarrhea.  You have pain when you urinate, or you have blood in your urine.  You have a rash on your body.  You have pain or redness where your IV access tube was inserted.  You have redness, swelling, or any kind of drainage from an incision. Get help right away if:  You have weakness or lightheadedness.  You have pain, swelling, or redness in your legs.  You have chest pain.  You faint.  You have shortness of breath.  You have heavy vaginal bleeding.  Your incision is opening up. This information is not intended to replace advice given to you by your health care provider. Make sure you discuss any questions you have with your health care provider. Document Released: 10/20/2010 Document Revised: 11/05/2015 Document Reviewed: 01/09/2013 Elsevier Interactive Patient Education  2017 [facility] @ [phone] if:  You have a temperature greater than or equal to 100.4 degrees Farenheit orally You have pain that is not made better by the pain medication given and taken as directed  You have excessive bleeding or problems urinating  Take Colace (Docusate Sodium/Stool Softener) 100 mg 2-3 times daily while taking narcotic pain medicine to avoid constipation or until bowel movements are regular. Take Ibuprofen 600 mg with food every 6 hours for 5 days then as needed for pain.   You may drive after 2  weeks You may walk up steps  You may shower  You may resume a regular  diet  Keep incisions clean and dry;  keep appointment for December 18, 2017 at 8:45 a.m. for staple removal Do not lift over 15 pounds for 6 weeks Avoid anything in vagina for 6 weeks (or until after your post-operative visit)

## 2017-12-09 NOTE — Progress Notes (Signed)
All discharge teaching completed with the patient. All printed discharge instructions and prescriptions given and explained to the patient. Patient instructed on how to take prescribed medications, and she verbalizes an understanding. Patient verbalizes an understanding of all instructions given and denies any questions or concerns.

## 2017-12-09 NOTE — Discharge Summary (Signed)
Physician Discharge Summary  Patient ID: Yolanda Meadows MRN: [id number] DOB/AGE: [birth year] 32 y.o.  Admit date: 12/07/2017 Discharge date: 12/09/2017  Admission Diagnoses:  Discharge Diagnoses:  Active Problems:   Uterine fibroid   Cyst of ovary   Fibroids   Discharged Condition: good  Hospital Course: Pt came in and had abdominal myomectomy, LOA, ovarian  Cystectomy.  She is doing very well.  She voids spontaneously and tolerated a regular diet.  She has done well and is ready for discharge  Consults: None  Significant Diagnostic Studies:   Treatments: IV hydration and surgery: as above  Discharge Exam: Blood pressure 121/79, pulse 94, temperature 98.6 F (37 C), temperature source Oral, resp. rate 20, height 5' 3.5" (1.613 m), weight 104.3 kg (230 lb), SpO2 100 %. General appearance: alert and cooperative Head: Normocephalic, without obvious abnormality, atraumatic Resp: clear to auscultation bilaterally GI: soft, non-tender; bowel sounds normal; no masses,  no organomegaly Pelvic: no adnexal masses or tenderness and scant vb Extremities: Homans sign is negative, no sign of DVT  Disposition:    Allergies as of 12/09/2017   No Known Allergies     Medication List    STOP taking these medications   ferrous sulfate 325 (65 FE) MG tablet   naproxen sodium 220 MG tablet Commonly known as:  ALEVE     TAKE these medications   docusate sodium 100 MG capsule Commonly known as:  COLACE Take 1 capsule (100 mg total) by mouth 2 (two) times daily.   fluticasone 50 MCG/ACT nasal spray Commonly known as:  FLONASE Place 1 spray into both nostrils daily.   ibuprofen 600 MG tablet Commonly known as:  ADVIL,MOTRIN Take 1 tablet (600 mg total) by mouth every 6 (six) hours.   nystatin cream Commonly known as:  MYCOSTATIN Apply 1 application topically 2 (two) times daily as needed (for dry/itchy skin.).   ondansetron 4 MG tablet Commonly known as:  ZOFRAN Take 1 tablet  (4 mg total) by mouth every 8 (eight) hours as needed for nausea or vomiting.   oxyCODONE-acetaminophen 5-325 MG tablet Commonly known as:  PERCOCET/ROXICET Take 1-2 tablets by mouth every 4 (four) hours as needed for moderate pain.   progesterone 100 MG capsule Commonly known as:  PROMETRIUM Take 100 mg by mouth at bedtime.   Vitamin D3 2000 units Tabs Take 4,000 Units by mouth at bedtime.      Follow-up Information    Crawford Givens, MD Follow up on 01/17/2018.   Specialty:  Obstetrics and Gynecology Why:  appointment time 1: 45 p.m. for post operative visit.  at 8:45 a. m..   Contact information: [place] [address] [phone]        Earnstine Regal, PA-C Follow up on 12/18/2017.   Specialty:  Obstetrics and Gynecology Why:  Appointment for Staple Removal at 8: 45 a.m. Contact information: [address] [place] [address] [phone]           Signed: Crawford Givens A 12/09/2017, 10:47 AM

## 2018-12-26 ENCOUNTER — Encounter (INDEPENDENT_AMBULATORY_CARE_PROVIDER_SITE_OTHER): Payer: Self-pay | Admitting: Family Medicine

## 2018-12-26 ENCOUNTER — Ambulatory Visit (INDEPENDENT_AMBULATORY_CARE_PROVIDER_SITE_OTHER): Payer: BC Managed Care – PPO | Admitting: Family Medicine

## 2018-12-26 ENCOUNTER — Other Ambulatory Visit: Payer: Self-pay

## 2018-12-26 VITALS — BP 107/71 | HR 90 | Temp 98.5°F | Ht 64.0 in | Wt 242.0 lb

## 2018-12-26 DIAGNOSIS — Z6841 Body Mass Index (BMI) 40.0 and over, adult: Secondary | ICD-10-CM

## 2018-12-26 DIAGNOSIS — R0602 Shortness of breath: Secondary | ICD-10-CM

## 2018-12-26 DIAGNOSIS — E559 Vitamin D deficiency, unspecified: Secondary | ICD-10-CM

## 2018-12-26 DIAGNOSIS — D508 Other iron deficiency anemias: Secondary | ICD-10-CM

## 2018-12-26 DIAGNOSIS — R5383 Other fatigue: Secondary | ICD-10-CM | POA: Diagnosis not present

## 2018-12-26 DIAGNOSIS — Z1331 Encounter for screening for depression: Secondary | ICD-10-CM

## 2018-12-26 DIAGNOSIS — Z0289 Encounter for other administrative examinations: Secondary | ICD-10-CM

## 2018-12-26 DIAGNOSIS — Z9189 Other specified personal risk factors, not elsewhere classified: Secondary | ICD-10-CM

## 2018-12-26 NOTE — Progress Notes (Signed)
.  Office: [phone]  /  Fax: [phone]   HPI:   Chief Complaint: OBESITY  Yolanda Meadows (MR# [id number]) is a 33 Yolanda Meadows.o. female who presents on 12/26/2018 for obesity evaluation and treatment. Current BMI is Body mass index is 41.54 kg/m. Yolanda Meadows has struggled with obesity for years and has been unsuccessful in either losing weight or maintaining long term weight loss. Yolanda Meadows attended our information session and states she is currently in the action stage of change and ready to dedicate time achieving and maintaining a healthier weight.   Yolanda Meadows heard about our clinic from another patient. She has a history of lactose intolerance. When she wakes up, she is doing 2 eggs, 3 pieces of bacon, and 1 piece of toast with butter (satisfied for 2 hours). She then eats chips if available. For lunch, she is doing 1 breast, 1 tenderloin chicken, and 2 cups of spinach (feels full for 2 hours). For snack, she is doing chips or cheez-its (gets her to dinner). For dinner, she is doing the same as lunch, and same portion. Then snacking on cereal or cereal bar.  Yolanda Meadows states her family eats meals together she thinks her family will eat healthier with  her her desired weight loss is 43 lbs she has been heavy most of  her life she started gaining weight after her surgery on 12/07/17 her heaviest weight ever was 247 lbs she has significant food cravings issues  she snacks frequently in the evenings she skips meals frequently she is frequently drinking liquids with calories she frequently makes poor food choices she has problems with excessive hunger  she frequently eats larger portions than normal  she struggles with emotional eating    Fatigue Yolanda Meadows feels her energy is lower than it should be. This has worsened with weight gain and has not worsened recently. Female admits to daytime somnolence and  denies waking up still tired. Patient is at risk for obstructive sleep apnea. Patent has a history of  symptoms of daytime fatigue. Patient generally gets 8 hours of sleep per night, and states they generally have generally restful sleep. Snoring is present. Apneic episodes are not present. Epworth Sleepiness Score is 9.  Dyspnea on exertion Yolanda Meadows notes increasing shortness of breath with exercising and seems to be worsening over time with weight gain. She notes getting out of breath sooner with activity than she used to. This has not gotten worse recently. EKG-normal sinus rhythm at 71 BPM. Yolanda Meadows denies orthopnea.  Vitamin D Deficiency Yolanda Meadows has a diagnosis of vitamin D deficiency. She is currently taking Vit D3 50 mcg. She notes fatigue and denies nausea, vomiting or muscle weakness.  At risk for osteopenia and osteoporosis Yolanda Meadows is at higher risk of osteopenia and osteoporosis due to vitamin D deficiency.   Iron Deficiency Anemia Yolanda Meadows has a diagnosis of anemia. She has not labs on file of iron panel. She is on iron supplementation.  Depression Screen Yolanda Meadows Food and Mood (modified PHQ-9) score was  Depression screen PHQ 2/9 12/26/2018  Decreased Interest 2  Down, Depressed, Hopeless 1  PHQ - 2 Score 3  Altered sleeping 0  Tired, decreased energy 2  Change in appetite 2  Feeling bad or failure about yourself  2  Trouble concentrating 0  Moving slowly or fidgety/restless 0  Suicidal thoughts 0  PHQ-9 Score 9  Difficult doing work/chores Not difficult at all    ASSESSMENT AND PLAN:  Other fatigue - Plan: EKG 12-Lead, Vitamin B12, Comprehensive metabolic  panel, Folate, Hemoglobin A1c, Insulin, random, T3, T4, free, TSH  Shortness of breath on exertion - Plan: Lipid Panel With LDL/HDL Ratio  Vitamin D deficiency - Plan: VITAMIN D 25 Hydroxy (Vit-D Deficiency, Fractures)  Other iron deficiency anemia - Plan: CBC With Differential  Depression screening  At risk for osteoporosis  Class 3 severe obesity with serious comorbidity and body mass index (BMI) of 40.0 to 44.9 in  adult, unspecified obesity type ([place])  PLAN:  Fatigue Yolanda Meadows was informed that her fatigue may be related to obesity, depression or many other causes. Labs will be ordered, and in the meanwhile Yolanda Meadows has agreed to work on diet, exercise and weight loss to help with fatigue. Proper sleep hygiene was discussed including the need for 7-8 hours of quality sleep each night. A sleep study was not ordered based on symptoms and Epworth score.  Dyspnea on exertion Yolanda Meadows's shortness of breath appears to be obesity related and exercise induced. She has agreed to work on weight loss and gradually increase exercise to treat her exercise induced shortness of breath. If Yolanda Meadows follows our instructions and loses weight without improvement of her shortness of breath, we will plan to refer to pulmonology. We will monitor this condition regularly. Yolanda Meadows agrees to this plan.  Vitamin D Deficiency Yolanda Meadows was informed that low vitamin D levels contributes to fatigue and are associated with obesity, breast, and colon cancer. Yolanda Meadows agrees to continue taking Vit D3 and will follow up for routine testing of vitamin D, at least 2-3 times per year. She was informed of the risk of over-replacement of vitamin D and agrees to not increase her dose unless she discusses this with [place] first. We will check Vit D level today. Yolanda Meadows agrees to follow up with our clinic in 2 weeks.  At risk for osteopenia and osteoporosis Yolanda Meadows was given extended (15 minutes) osteoporosis prevention counseling today. Yolanda Meadows is at risk for osteopenia and osteoporsis due to her vitamin D deficiency. She was encouraged to take her vitamin D and follow her higher calcium diet and increase strengthening exercise to help strengthen her bones and decrease her risk of osteopenia and osteoporosis.  Iron Deficiency Anemia The diagnosis of Iron deficiency anemia was discussed with Yolanda Meadows and was explained in detail. She was given suggestions of iron rich  foods and iron supplement was not prescribed. We will check anemia panel today. Yolanda Meadows agrees to follow up with our clinic in 2 weeks.  Depression Screen Yolanda Meadows had a mildly positive depression screening. Depression is commonly associated with obesity and often results in emotional eating behaviors. We will monitor this closely and work on CBT to help improve the non-hunger eating patterns. Referral to Psychology may be required if no improvement is seen as she continues in our clinic.  Obesity Yolanda Meadows is currently in the action stage of change and her goal is to continue with weight loss efforts She has agreed to follow the Category 2 plan + 100 calories Yolanda Meadows has been instructed to work up to a goal of 150 minutes of combined cardio and strengthening exercise per week for weight loss and overall health benefits. We discussed the following Behavioral Modification Strategies today: increasing lean protein intake, increasing vegetables and work on meal planning and easy cooking plans, keeping healthy foods in the home, and planning for success  Yolanda Meadows has agreed to follow up with our clinic in 2 weeks. She was informed of the importance of frequent follow up visits to maximize her success with  intensive lifestyle modifications for her multiple health conditions. She was informed we would discuss her lab results at her next visit unless there is a critical issue that needs to be addressed sooner. Yolanda Meadows agreed to keep her next visit at the agreed upon time to discuss these results.  ALLERGIES: No Known Allergies  MEDICATIONS: Current Outpatient Medications on File Prior to Visit  Medication Sig Dispense Refill  . Biotin 800 MCG TABS Take by mouth.    . Cholecalciferol (VITAMIN D3) 2000 units TABS Take 50 mcg by mouth 2 (two) times a day.     . ferrous sulfate 324 MG TBEC Take 324 mg by mouth.    . fluticasone (FLONASE) 50 MCG/ACT nasal spray Place 1 spray into both nostrils daily.    Marland Kitchen nystatin  cream (MYCOSTATIN) Apply 1 application topically 2 (two) times daily as needed (for dry/itchy skin.).    Marland Kitchen docusate sodium (COLACE) 100 MG capsule Take 1 capsule (100 mg total) by mouth 2 (two) times daily. (Patient not taking: Reported on 12/26/2018) 10 capsule 0  . ibuprofen (ADVIL,MOTRIN) 600 MG tablet Take 1 tablet (600 mg total) by mouth every 6 (six) hours. (Patient not taking: Reported on 12/26/2018) 30 tablet 0  . ondansetron (ZOFRAN) 4 MG tablet Take 1 tablet (4 mg total) by mouth every 8 (eight) hours as needed for nausea or vomiting. (Patient not taking: Reported on 12/26/2018) 20 tablet 0  . oxyCODONE-acetaminophen (PERCOCET/ROXICET) 5-325 MG tablet Take 1-2 tablets by mouth every 4 (four) hours as needed for moderate pain. (Patient not taking: Reported on 12/26/2018) 30 tablet 0  . progesterone (PROMETRIUM) 100 MG capsule Take 100 mg by mouth at bedtime.     No current facility-administered medications on file prior to visit.     PAST MEDICAL HISTORY: Past Medical History:  Diagnosis Date  . Anemia   . Anxiety    no meds  . Bilateral swelling of feet   . Fibroid   . GERD (gastroesophageal reflux disease)    diet controlled - no meds  . H/O seasonal allergies   . IBS (irritable bowel syndrome)   . Lactose intolerance   . Vitamin D deficiency     PAST SURGICAL HISTORY: Past Surgical History:  Procedure Laterality Date  . APPENDECTOMY    . EXCISION OF SKIN TAG N/A 12/07/2017   Procedure: EXCISION OF ABDOMINAL SKIN TAG;  Surgeon: Crawford Givens, MD;  Location: [facility];  Service: Gynecology;  Laterality: N/A;  . MYOMECTOMY N/A 12/07/2017   Procedure: ABDOMINAL MYOMECTOMY;  Surgeon: Crawford Givens, MD;  Location: [facility];  Service: Gynecology;  Laterality: N/A;  . OVARIAN CYST REMOVAL Left 12/07/2017   Procedure: OVARIAN CYSTECTOMY;  Surgeon: Crawford Givens, MD;  Location: [facility];  Service: Gynecology;  Laterality: Left;  LEFT OVARIAN DERMOID CYST  . UPPER GI ENDOSCOPY       SOCIAL HISTORY: Social History   Tobacco Use  . Smoking status: Never Smoker  . Smokeless tobacco: Never Used  Substance Use Topics  . Alcohol use: Never    Frequency: Never  . Drug use: Never    FAMILY HISTORY: Family History  Problem Relation Age of Onset  . Hypertension Mother   . Heart disease Mother   . Sudden death Mother   . Obesity Mother   . Diabetes Father   . Hypertension Father   . Sleep apnea Father   . Obesity Father     ROS: Review of Systems  Constitutional: Positive for malaise/fatigue. Negative  for weight loss.  Respiratory: Positive for shortness of breath (with exertion).   Cardiovascular: Negative for orthopnea.       + Chest pain/discomfort  Gastrointestinal: Positive for heartburn.  Endo/Heme/Allergies:       + Excessive thirst + Excessive hunger  Psychiatric/Behavioral:       + Stress    PHYSICAL EXAM: Blood pressure 107/71, pulse 90, temperature 98.5 F (36.9 C), temperature source Oral, height 5\' 4"  (1.626 m), weight 242 lb (109.8 kg), SpO2 98 %. Body mass index is 41.54 kg/m. Physical Exam Vitals signs reviewed.  Constitutional:      Appearance: Normal appearance. She is obese.  HENT:     Head: Normocephalic and atraumatic.     Nose: Nose normal.  Eyes:     General: No scleral icterus.    Extraocular Movements: Extraocular movements intact.  Neck:     Musculoskeletal: Normal range of motion and neck supple.     Comments: No thyromegaly present Cardiovascular:     Rate and Rhythm: Normal rate and regular rhythm.     Pulses: Normal pulses.     Heart sounds: Normal heart sounds.  Pulmonary:     Effort: Pulmonary effort is normal. No respiratory distress.     Breath sounds: Normal breath sounds.  Abdominal:     Palpations: Abdomen is soft.     Tenderness: There is no abdominal tenderness.     Comments: + Obesity  Musculoskeletal: Normal range of motion.     Right lower leg: No edema.     Left lower leg: No edema.  Skin:     General: Skin is warm and dry.  Neurological:     Mental Status: She is alert and oriented to person, place, and time.     Coordination: Coordination normal.  Psychiatric:        Mood and Affect: Mood normal.        Behavior: Behavior normal.     RECENT LABS AND TESTS: BMET    Component Value Date/Time   NA 135 12/08/2017 0548   K 4.4 12/08/2017 0548   CL 102 12/08/2017 0548   CO2 26 12/08/2017 0548   GLUCOSE 100 (H) 12/08/2017 0548   BUN 12 12/08/2017 0548   CREATININE 1.05 (H) 12/08/2017 0548   CALCIUM 8.5 (L) 12/08/2017 0548   GFRNONAA >60 12/08/2017 0548   GFRAA >60 12/08/2017 0548   No results found for: HGBA1C No results found for: INSULIN CBC    Component Value Date/Time   WBC 7.8 12/08/2017 0548   RBC 4.15 12/08/2017 0548   HGB 11.1 (L) 12/08/2017 0548   HCT 34.0 (L) 12/08/2017 0548   PLT 219 12/08/2017 0548   MCV 81.9 12/08/2017 0548   MCH 26.7 12/08/2017 0548   MCHC 32.6 12/08/2017 0548   RDW 13.3 12/08/2017 0548   Iron/TIBC/Ferritin/ %Sat No results found for: IRON, TIBC, FERRITIN, IRONPCTSAT Lipid Panel  No results found for: CHOL, TRIG, HDL, CHOLHDL, VLDL, LDLCALC, LDLDIRECT Hepatic Function Panel  No results found for: PROT, ALBUMIN, AST, ALT, ALKPHOS, BILITOT, BILIDIR, IBILI No results found for: TSH   ECG  shows NSR with a rate of 71 BPM INDIRECT CALORIMETER done today shows a VO2 of 238 and a REE of 1656. Her calculated basal metabolic rate is 5329 thus her basal metabolic rate is worse than expected.       OBESITY BEHAVIORAL INTERVENTION VISIT  Today's visit was # 1   Starting weight: 242 lbs Starting date: 12/26/2018 Today's  weight : 242 lbs  Today's date: 12/26/2018 Total lbs lost to date: 0    ASK: We discussed the diagnosis of obesity with Idalia Needle today and Yolanda Meadows agreed to give [place] permission to discuss obesity behavioral modification therapy today.  ASSESS: Yolanda Meadows has the diagnosis of obesity and her BMI today is  41.52 Yolanda Meadows is in the action stage of change   ADVISE: Yolanda Meadows was educated on the multiple health risks of obesity as well as the benefit of weight loss to improve her health. She was advised of the need for long term treatment and the importance of lifestyle modifications to improve her current health and to decrease her risk of future health problems.  AGREE: Multiple dietary modification options and treatment options were discussed and  Yolanda Meadows agreed to follow the recommendations documented in the above note.  ARRANGE: Kynadee was educated on the importance of frequent visits to treat obesity as outlined per CMS and USPSTF guidelines and agreed to schedule her next follow up appointment today.   I, Trixie Dredge, am acting as transcriptionist for Ilene Qua, MD    I have reviewed the above documentation for accuracy and completeness, and I agree with the above. - Ilene Qua, MD

## 2018-12-27 LAB — CBC WITH DIFFERENTIAL
Basophils Absolute: 0 10*3/uL (ref 0.0–0.2)
Basos: 0 %
EOS (ABSOLUTE): 0.1 10*3/uL (ref 0.0–0.4)
Eos: 1 %
Hematocrit: 40.4 % (ref 34.0–46.6)
Hemoglobin: 13 g/dL (ref 11.1–15.9)
Immature Grans (Abs): 0 10*3/uL (ref 0.0–0.1)
Immature Granulocytes: 0 %
Lymphocytes Absolute: 1.3 10*3/uL (ref 0.7–3.1)
Lymphs: 27 %
MCH: 25.8 pg — ABNORMAL LOW (ref 26.6–33.0)
MCHC: 32.2 g/dL (ref 31.5–35.7)
MCV: 80 fL (ref 79–97)
Monocytes Absolute: 0.4 10*3/uL (ref 0.1–0.9)
Monocytes: 9 %
Neutrophils Absolute: 2.9 10*3/uL (ref 1.4–7.0)
Neutrophils: 63 %
RBC: 5.04 x10E6/uL (ref 3.77–5.28)
RDW: 13.9 % (ref 11.7–15.4)
WBC: 4.7 10*3/uL (ref 3.4–10.8)

## 2018-12-27 LAB — HEMOGLOBIN A1C
Est. average glucose Bld gHb Est-mCnc: 111 mg/dL
Hgb A1c MFr Bld: 5.5 % (ref 4.8–5.6)

## 2018-12-27 LAB — INSULIN, RANDOM: INSULIN: 29.4 u[IU]/mL — ABNORMAL HIGH (ref 2.6–24.9)

## 2018-12-27 LAB — LIPID PANEL WITH LDL/HDL RATIO
Cholesterol, Total: 190 mg/dL (ref 100–199)
HDL: 49 mg/dL (ref >39)
LDL Calculated: 121 mg/dL — ABNORMAL HIGH (ref 0–99)
LDl/HDL Ratio: 2.5 ratio (ref 0.0–3.2)
Triglycerides: 99 mg/dL (ref 0–149)
VLDL Cholesterol Cal: 20 mg/dL (ref 5–40)

## 2018-12-27 LAB — COMPREHENSIVE METABOLIC PANEL
ALT: 19 IU/L (ref 0–32)
AST: 14 IU/L (ref 0–40)
Albumin/Globulin Ratio: 1.3 (ref 1.2–2.2)
Albumin: 4.4 g/dL (ref 3.9–5.0)
Alkaline Phosphatase: 49 IU/L (ref 39–117)
BUN/Creatinine Ratio: 12 (ref 9–23)
BUN: 11 mg/dL (ref 6–20)
Bilirubin Total: 0.4 mg/dL (ref 0.0–1.2)
CO2: 21 mmol/L (ref 20–29)
Calcium: 9.5 mg/dL (ref 8.7–10.2)
Chloride: 101 mmol/L (ref 96–106)
Creatinine, Ser: 0.93 mg/dL (ref 0.57–1.00)
GFR calc Af Amer: 95 mL/min/{1.73_m2} (ref >59)
GFR calc non Af Amer: 83 mL/min/{1.73_m2} (ref >59)
Globulin, Total: 3.4 g/dL (ref 1.5–4.5)
Glucose: 88 mg/dL (ref 65–99)
Potassium: 4.8 mmol/L (ref 3.5–5.2)
Sodium: 137 mmol/L (ref 134–144)
Total Protein: 7.8 g/dL (ref 6.0–8.5)

## 2018-12-27 LAB — FOLATE: Folate: 11.8 ng/mL (ref >3.0)

## 2018-12-27 LAB — VITAMIN D 25 HYDROXY (VIT D DEFICIENCY, FRACTURES): Vit D, 25-Hydroxy: 36.8 ng/mL (ref 30.0–100.0)

## 2018-12-27 LAB — TSH: TSH: 1.59 u[IU]/mL (ref 0.450–4.500)

## 2018-12-27 LAB — VITAMIN B12: Vitamin B-12: 643 pg/mL (ref 232–1245)

## 2018-12-27 LAB — T4, FREE: Free T4: 1.19 ng/dL (ref 0.82–1.77)

## 2018-12-27 LAB — T3: T3, Total: 125 ng/dL (ref 71–180)
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
## 2019-01-02 ENCOUNTER — Other Ambulatory Visit: Payer: Self-pay

## 2019-01-02 ENCOUNTER — Ambulatory Visit (INDEPENDENT_AMBULATORY_CARE_PROVIDER_SITE_OTHER): Payer: BC Managed Care – PPO | Admitting: Psychology

## 2019-01-02 DIAGNOSIS — F3289 Other specified depressive episodes: Secondary | ICD-10-CM

## 2019-01-09 ENCOUNTER — Encounter (INDEPENDENT_AMBULATORY_CARE_PROVIDER_SITE_OTHER): Payer: Self-pay | Admitting: Family Medicine

## 2019-01-09 ENCOUNTER — Other Ambulatory Visit: Payer: Self-pay

## 2019-01-09 ENCOUNTER — Ambulatory Visit (INDEPENDENT_AMBULATORY_CARE_PROVIDER_SITE_OTHER): Payer: BC Managed Care – PPO | Admitting: Family Medicine

## 2019-01-09 VITALS — BP 113/71 | HR 92 | Temp 98.4°F | Ht 64.0 in | Wt 240.0 lb

## 2019-01-09 DIAGNOSIS — E559 Vitamin D deficiency, unspecified: Secondary | ICD-10-CM

## 2019-01-09 DIAGNOSIS — Z6841 Body Mass Index (BMI) 40.0 and over, adult: Secondary | ICD-10-CM

## 2019-01-09 DIAGNOSIS — E7849 Other hyperlipidemia: Secondary | ICD-10-CM

## 2019-01-09 DIAGNOSIS — E8881 Metabolic syndrome: Secondary | ICD-10-CM

## 2019-01-09 DIAGNOSIS — Z9189 Other specified personal risk factors, not elsewhere classified: Secondary | ICD-10-CM

## 2019-01-09 NOTE — Progress Notes (Signed)
Office: [phone]  /  Fax: [phone]   HPI:   Chief Complaint: OBESITY Yolanda Meadows is here to discuss her progress with her obesity treatment plan. She is on the Category 2 plan and is following her eating plan approximately 80% of the time. She states she is exercising 0 minutes 0 times per week. Yolanda Meadows had a family cookout the first weekend and did do some indulgent eating with cashews and chocolate. She is going to the beach for the weekend and and is worried about eating, especially lunch. She reports no hunger following the plan except if she didn't measure appropriately. She eats watermelon and goldfish for snacks.  Her weight is 240 lb (108.9 kg) today and has had a weight loss of 2 pounds over a period of 2 weeks since her last visit. She has lost 2 lbs since starting treatment with [place].  Insulin Resistance Yolanda Meadows has a diagnosis of insulin resistance based on her elevated fasting insulin level >5. Her  A1c was 5.5 on 12/26/2018 and insulin was 29.4. Although Lashaya's blood glucose readings are still under good control, insulin resistance puts her at greater risk of metabolic syndrome and diabetes. She is not taking metformin currently and continues to work on diet and exercise to decrease risk of diabetes.  Vitamin D deficiency Yolanda Meadows has a diagnosis of Vitamin D deficiency. She is currently taking OTC Vit D 4,000 IU a day and denies nausea, vomiting or muscle weakness but does report fatigue.  At risk for osteopenia and osteoporosis Yolanda Meadows is at higher risk of osteopenia and osteoporosis due to Vitamin D deficiency.   Hyperlipidemia Yolanda Meadows has hyperlipidemia and is not on a statin. She has been trying to improve her cholesterol levels with intensive lifestyle modification including a low saturated fat diet, exercise and weight loss. Her LDL was 121 on 12/26/2018 and HDL was 49.   ASSESSMENT AND PLAN:  Insulin resistance  Vitamin D deficiency - Plan: Vitamin D, Ergocalciferol,  (DRISDOL) 1.25 MG (50000 UT) CAPS capsule  Other hyperlipidemia  At risk for osteoporosis  Class 3 severe obesity with serious comorbidity and body mass index (BMI) of 40.0 to 44.9 in adult, unspecified obesity type ([place])  PLAN:  Insulin Resistance Yolanda Meadows will continue to work on weight loss, exercise, and decreasing simple carbohydrates in her diet to help decrease the risk of diabetes. We dicussed metformin including benefits and risks. She was informed that eating too many simple carbohydrates or too many calories at one sitting increases the likelihood of GI side effects. Yolanda Meadows will continue on Category 2 and will have repeat labs in 3 months She will follow-up with [place] as directed to monitor her progress.  Vitamin D Deficiency Yolanda Meadows was informed that low Vitamin D levels contributes to fatigue and are associated with obesity, breast, and colon cancer. She will discontinue OTC Vitamin D and will start prescription Vit D @ 50,000 IU every week #4 with 0 refills. She will follow-up for routine testing of Vitamin D, at least 2-3 times per year. She was informed of the risk of over-replacement of Vitamin D and agrees to not increase her dose unless she discusses this with [place] first. Yolanda Meadows agrees to follow-up with our clinic in 2 weeks.  At risk for osteopenia and osteoporosis Yolanda Meadows was given extended  (30 minutes) osteoporosis prevention counseling today. Yolanda Meadows is at risk for osteopenia and osteoporsis due to her Vitamin D deficiency. She was encouraged to take her Vitamin D and follow her higher calcium diet  and increase strengthening exercise to help strengthen her bones and decrease her risk of osteopenia and osteoporosis.  Hyperlipidemia Yolanda Meadows was informed of the American Heart Association Guidelines emphasizing intensive lifestyle modifications as the first line treatment for hyperlipidemia. We discussed many lifestyle modifications today in depth, and Yolanda Meadows will continue to work on  decreasing saturated fats such as fatty red meat, butter and many fried foods. She will have repeat labs in 3 months. She will also increase vegetables and lean protein in her diet and continue to work on exercise and weight loss efforts.  Obesity Yolanda Meadows is currently in the action stage of change. As such, her goal is to continue with weight loss efforts. She has agreed to follow the Category 2 plan + 100 calories. Yolanda Meadows has been instructed to work up to a goal of 150 minutes of combined cardio and strengthening exercise per week for weight loss and overall health benefits. We discussed the following Behavioral Modification Strategies today: increasing lean protein intake, increasing vegetables, work on meal planning and easy cooking plans, keeping healthy foods in the home, and planning for success.  Yolanda Meadows has agreed to follow-up with our clinic in 2 weeks. She was informed of the importance of frequent follow-up visits to maximize her success with intensive lifestyle modifications for her multiple health conditions.  ALLERGIES: No Known Allergies  MEDICATIONS: Current Outpatient Medications on File Prior to Visit  Medication Sig Dispense Refill  . Biotin 800 MCG TABS Take by mouth.    . Cholecalciferol (VITAMIN D3) 2000 units TABS Take 50 mcg by mouth 2 (two) times a day.     . docusate sodium (COLACE) 100 MG capsule Take 1 capsule (100 mg total) by mouth 2 (two) times daily. (Patient not taking: Reported on 12/26/2018) 10 capsule 0  . ferrous sulfate 324 MG TBEC Take 324 mg by mouth.    . fluticasone (FLONASE) 50 MCG/ACT nasal spray Place 1 spray into both nostrils daily.    Marland Kitchen ibuprofen (ADVIL,MOTRIN) 600 MG tablet Take 1 tablet (600 mg total) by mouth every 6 (six) hours. (Patient not taking: Reported on 12/26/2018) 30 tablet 0  . nystatin cream (MYCOSTATIN) Apply 1 application topically 2 (two) times daily as needed (for dry/itchy skin.).    Marland Kitchen ondansetron (ZOFRAN) 4 MG tablet Take 1  tablet (4 mg total) by mouth every 8 (eight) hours as needed for nausea or vomiting. (Patient not taking: Reported on 12/26/2018) 20 tablet 0  . oxyCODONE-acetaminophen (PERCOCET/ROXICET) 5-325 MG tablet Take 1-2 tablets by mouth every 4 (four) hours as needed for moderate pain. (Patient not taking: Reported on 12/26/2018) 30 tablet 0  . progesterone (PROMETRIUM) 100 MG capsule Take 100 mg by mouth at bedtime.     No current facility-administered medications on file prior to visit.     PAST MEDICAL HISTORY: Past Medical History:  Diagnosis Date  . Anemia   . Anxiety    no meds  . Bilateral swelling of feet   . Fibroid   . GERD (gastroesophageal reflux disease)    diet controlled - no meds  . H/O seasonal allergies   . IBS (irritable bowel syndrome)   . Lactose intolerance   . Vitamin D deficiency     PAST SURGICAL HISTORY: Past Surgical History:  Procedure Laterality Date  . APPENDECTOMY    . EXCISION OF SKIN TAG N/A 12/07/2017   Procedure: EXCISION OF ABDOMINAL SKIN TAG;  Surgeon: Crawford Givens, MD;  Location: [facility];  Service: Gynecology;  Laterality:  N/A;  . MYOMECTOMY N/A 12/07/2017   Procedure: ABDOMINAL MYOMECTOMY;  Surgeon: Crawford Givens, MD;  Location: [facility];  Service: Gynecology;  Laterality: N/A;  . OVARIAN CYST REMOVAL Left 12/07/2017   Procedure: OVARIAN CYSTECTOMY;  Surgeon: Crawford Givens, MD;  Location: [facility];  Service: Gynecology;  Laterality: Left;  LEFT OVARIAN DERMOID CYST  . UPPER GI ENDOSCOPY      SOCIAL HISTORY: Social History   Tobacco Use  . Smoking status: Never Smoker  . Smokeless tobacco: Never Used  Substance Use Topics  . Alcohol use: Never    Frequency: Never  . Drug use: Never    FAMILY HISTORY: Family History  Problem Relation Age of Onset  . Hypertension Mother   . Heart disease Mother   . Sudden death Mother   . Obesity Mother   . Diabetes Father   . Hypertension Father   . Sleep apnea Father   . Obesity Father    ROS:  Review of Systems  Constitutional: Positive for malaise/fatigue.  Gastrointestinal: Negative for nausea and vomiting.  Musculoskeletal:       Negative for muscle weakness.   PHYSICAL EXAM: Blood pressure 113/71, pulse 92, temperature 98.4 F (36.9 C), temperature source Oral, height 5\' 4"  (1.626 m), weight 240 lb (108.9 kg), SpO2 98 %. Body mass index is 41.2 kg/m. Physical Exam Vitals signs reviewed.  Constitutional:      Appearance: Normal appearance. She is obese.  Cardiovascular:     Rate and Rhythm: Normal rate.     Pulses: Normal pulses.  Pulmonary:     Effort: Pulmonary effort is normal.     Breath sounds: Normal breath sounds.  Musculoskeletal: Normal range of motion.  Skin:    General: Skin is warm and dry.  Neurological:     Mental Status: She is alert and oriented to person, place, and time.  Psychiatric:        Behavior: Behavior normal.   RECENT LABS AND TESTS: BMET    Component Value Date/Time   NA 137 12/26/2018 1555   K 4.8 12/26/2018 1555   CL 101 12/26/2018 1555   CO2 21 12/26/2018 1555   GLUCOSE 88 12/26/2018 1555   GLUCOSE 100 (H) 12/08/2017 0548   BUN 11 12/26/2018 1555   CREATININE 0.93 12/26/2018 1555   CALCIUM 9.5 12/26/2018 1555   GFRNONAA 83 12/26/2018 1555   GFRAA 95 12/26/2018 1555   Lab Results  Component Value Date   HGBA1C 5.5 12/26/2018   Lab Results  Component Value Date   INSULIN 29.4 (H) 12/26/2018   CBC    Component Value Date/Time   WBC 4.7 12/26/2018 1555   WBC 7.8 12/08/2017 0548   RBC 5.04 12/26/2018 1555   RBC 4.15 12/08/2017 0548   HGB 13.0 12/26/2018 1555   HCT 40.4 12/26/2018 1555   PLT 219 12/08/2017 0548   MCV 80 12/26/2018 1555   MCH 25.8 (L) 12/26/2018 1555   MCH 26.7 12/08/2017 0548   MCHC 32.2 12/26/2018 1555   MCHC 32.6 12/08/2017 0548   RDW 13.9 12/26/2018 1555   LYMPHSABS 1.3 12/26/2018 1555   EOSABS 0.1 12/26/2018 1555   BASOSABS 0.0 12/26/2018 1555   Iron/TIBC/Ferritin/ %Sat No results  found for: IRON, TIBC, FERRITIN, IRONPCTSAT Lipid Panel     Component Value Date/Time   CHOL 190 12/26/2018 1555   TRIG 99 12/26/2018 1555   HDL 49 12/26/2018 1555   LDLCALC 121 (H) 12/26/2018 1555   Hepatic Function Panel  Component Value Date/Time   PROT 7.8 12/26/2018 1555   ALBUMIN 4.4 12/26/2018 1555   AST 14 12/26/2018 1555   ALT 19 12/26/2018 1555   ALKPHOS 49 12/26/2018 1555   BILITOT 0.4 12/26/2018 1555      Component Value Date/Time   TSH 1.590 12/26/2018 1555   Results for JOLAINE, FRYBERGER (MRN [id number]) as of 01/09/2019 15:31  Ref. Range 12/26/2018 15:55  Vitamin D, 25-Hydroxy Latest Ref Range: 30.0 - 100.0 ng/mL 36.8   OBESITY BEHAVIORAL INTERVENTION VISIT  Today's visit was #2  Starting weight: 242 lbs Starting date: 12/26/2018 Today's weight: 240 lbs  Today's date: 01/09/2019 Total lbs lost to date: 2   01/09/2019  Height 5\' 4"  (1.626 m)  Weight 240 lb (108.9 kg)  BMI (Calculated) 41.18  BLOOD PRESSURE - SYSTOLIC 594  BLOOD PRESSURE - DIASTOLIC 71   Body Fat % 58.5 %  Total Body Water (lbs) 89.6 lbs   ASK: We discussed the diagnosis of obesity with Yolanda Meadows today and Yolanda Meadows agreed to give [place] permission to discuss obesity behavioral modification therapy today.  ASSESS: Andilynn has the diagnosis of obesity and her BMI today is 41.3. Jalexa is in the action stage of change.   ADVISE: Yolanda Meadows was educated on the multiple health risks of obesity as well as the benefit of weight loss to improve her health. She was advised of the need for long term treatment and the importance of lifestyle modifications to improve her current health and to decrease her risk of future health problems.  AGREE: Multiple dietary modification options and treatment options were discussed and  Aaminah agreed to follow the recommendations documented in the above note.  ARRANGE: Rajah was educated on the importance of frequent visits to treat obesity as outlined per CMS and  USPSTF guidelines and agreed to schedule her next follow up appointment today.  I, Michaelene Song, am acting as transcriptionist for Yolanda Qua, MD  I have reviewed the above documentation for accuracy and completeness, and I agree with the above. - Yolanda Qua, MD

## 2019-01-10 MED ORDER — VITAMIN D (ERGOCALCIFEROL) 1.25 MG (50000 UNIT) PO CAPS: 50000.0000 [IU] | ORAL_CAPSULE | ORAL | 0 refills | Status: AC

## 2019-01-14 ENCOUNTER — Encounter (INDEPENDENT_AMBULATORY_CARE_PROVIDER_SITE_OTHER): Payer: Self-pay | Admitting: Family Medicine

## 2019-01-14 NOTE — Telephone Encounter (Signed)
Please advise 

## 2019-01-15 NOTE — Telephone Encounter (Signed)
Please reschedule

## 2019-01-15 NOTE — Telephone Encounter (Signed)
Ashleigh please route this to Dr. Mallie Mussel as I don't have a problem with the patient moving her appointment.

## 2019-01-16 NOTE — Progress Notes (Unsigned)
Office: [phone]  /  Fax: [phone]    Date: January 17, 2019   Appointment Start Time:*** Duration:*** Provider: Glennie Isle, Psy.D. Type of Session: Individual Therapy  Location of Patient: *** Location of Provider: [facility] Type of Contact: Telepsychological Visit via [employer] WebEx   Session Content: Dnasia is a 33 y.o. female presenting via [place] for a follow-up appointment to address the previously established treatment goal of decreasing emotional eating. Today's appointment was a telepsychological visit, as this provider's clinic is seeing a limited number of patients for in-person visits due to COVID-19. Therapeutic services will resume to in-person appointments once deemed appropriate. Clairissa expressed understanding regarding the rationale for telepsychological services, and provided verbal consent for today's appointment. Prior to proceeding with today's appointment, Torina's physical location at the time of this appointment was obtained. Shyla reported she was at *** and provided the address. In the event of technical difficulties, Aisha shared a phone number she could be reached at. Mylinda and this provider participated in today's telepsychological service. Also, Huntley denied anyone else being present in the room or on the WebEx appointment ***.  This provider conducted a brief check-in and verbally administered the PHQ-9 and GAD-7. ***   Sunnie was receptive to today's session as evidenced by openness to sharing, responsiveness to feedback, and ***.  Mental Status Examination:  Appearance: {Appearance:22431} Behavior: {Behavior:22445} Mood: {Teletherapy mood:22435} Affect: {Affect:22436} Speech: {Speech:22432} Eye Contact: {Eye Contact:22433} Psychomotor Activity: {Motor Activity:22434} Thought Process: {thought process:22448}  Content/Perceptual Disturbances: {disturbances:22451} Orientation: {Orientation:22437} Cognition/Sensorium:  {gbcognition:22449} Insight: {Insight:22446} Judgment: {Insight:22446}  Structured Assessment Results: The Patient Health Questionnaire-9 (PHQ-9) is a self-report measure that assesses symptoms and severity of depression over the course of the last two weeks. Brad obtained a score of *** suggesting {GBPHQ9SEVERITY:21752}. Amelie finds the endorsed symptoms to be {gbphq9difficulty:21754}. Little interest or pleasure in doing things ***  Feeling down, depressed, or hopeless ***  Trouble falling or staying asleep, or sleeping too much ***  Feeling tired or having little energy ***  Poor appetite or overeating ***  Feeling bad about yourself --- or that you are a failure or have let yourself or your family down ***  Trouble concentrating on things, such as reading the newspaper or watching television ***  Moving or speaking so slowly that other people could have noticed? Or the opposite --- being so fidgety or restless that you have been moving around a lot more than usual ***  Thoughts that you would be better off dead or hurting yourself in some way ***  PHQ-9 Score ***    The Generalized Anxiety Disorder-7 (GAD-7) is a brief self-report measure that assesses symptoms of anxiety over the course of the last two weeks. Dorlis obtained a score of *** suggesting {gbgad7severity:21753}. Conner finds the endorsed symptoms to be {gbphq9difficulty:21754}. Feeling nervous, anxious, on edge ***  Not being able to stop or control worrying ***  Worrying too much about different things ***  Trouble relaxing ***  Being so restless that it's hard to sit still ***  Becoming easily annoyed or irritable ***  Feeling afraid as if something awful might happen ***  GAD-7 Score ***   Interventions:  {Interventions:22172}  DSM-5 Diagnosis: 311 (F32.8) Other Specified Depressive Disorder, Emotional Eating Behaviors  Treatment Goal & Progress: During the initial appointment with this provider, the following  treatment goal was established: decrease emotional eating. Progress is limited, as Lina has just begun treatment with this provider; however, she is receptive  to the interaction and interventions and rapport is being established.   Plan: Martina continues to appear able and willing to participate as evidenced by engagement in reciprocal conversation, and asking questions for clarification as appropriate. The next appointment will be scheduled in {gbweeks:21758}, which will be via [employer]. Once this provider's office resumes in-person appointments and it is deemed appropriate, Jermiyah will be notified. The next session will focus on reviewing learned skills, and working towards the established treatment goal.***

## 2019-01-17 ENCOUNTER — Telehealth (INDEPENDENT_AMBULATORY_CARE_PROVIDER_SITE_OTHER): Payer: Self-pay | Admitting: Psychology

## 2019-01-17 ENCOUNTER — Ambulatory Visit (INDEPENDENT_AMBULATORY_CARE_PROVIDER_SITE_OTHER): Payer: BC Managed Care – PPO | Admitting: Psychology

## 2019-01-17 NOTE — Telephone Encounter (Signed)
  Office: [phone]  /  Fax: [phone]  Date of Call: January 17, 2019  Time of Call: 11:02am Provider: Glennie Isle, PsyD  CONTENT: This provider called Carrye to check-in as she did not present for today's Webex appointment at 11:00am. A HIPAA compliant voicemail was left requesting a call back. Of note, this provider stayed on the [facility] appointment for 7 minutes prior to signing off, which is 2 additional minutes past the clinic's 5 minute grace period policy.    PLAN: This provider will wait for Massiah to call back. If deemed necessary, this provider or the provider's clinic will call Su again in approximately one week.

## 2019-01-22 NOTE — Progress Notes (Signed)
Office: [phone]  /  Fax: [phone]    Date: January 23, 2019   Appointment Start Time: 4:34pm Duration: 28 minutes Provider: Glennie Isle, Psy.D. Type of Session: Individual Therapy  Location of Patient: Home Location of Provider: [facility] Type of Contact: Telepsychological Visit via [employer] Web[employer]   Session Content: Yolanda Meadows is a 33 y.o. female presenting via [place] for a follow-up appointment to address the previously established treatment goal of decreasing emotional eating. Today's appointment was a telepsychological visit, as this provider's clinic is seeing a limited number of patients for in-person visits due to COVID-19. Therapeutic services will resume to in-person appointments once deemed appropriate. Yolanda Meadows expressed understanding regarding the rationale for telepsychological services, and provided verbal consent for today's appointment. Prior to proceeding with today's appointment, Yolanda Meadows's physical location at the time of this appointment was obtained. Yolanda Meadows reported she was at home and provided the address. In the event of technical difficulties, Yolanda Meadows shared a phone number she could be reached at. Yolanda Meadows and this provider participated in today's telepsychological service. Also, Yolanda Meadows denied anyone else being present in the room or on the WebEx appointment.  This provider conducted a brief check-in and verbally administered the PHQ-9 and GAD-7. Yolanda Meadows reported, "I was struggling really bad Thursday." This was explored further. Yolanda Meadows stated she ate what she wanted to eat. She also acknowledged deviating from the structured meal plan while on vacation, but she also described cooking meals congruent to the meal plan when possible. Based on what Yolanda Meadows shared, psychoeducation regarding all or nothing thinking was provided. Cooking congruent to the meal plan when possible while on vacation was positively reinforced. Moreover, to assist Yolanda Meadows in her  previously established goal, psychoeducation regarding triggers for emotional eating was provided. Yolanda Meadows was provided a handout, and encouraged to utilize the handout between now and the next appointment to increase awareness of triggers and frequency. Yolanda Meadows agreed. This provider also discussed behavioral strategies for specific triggers, such as placing the utensil down when conversing to avoid mindless eating. Yolanda Meadows provided verbal consent during today's appointment for this provider to send the handout about triggers via e-mail. Overall, Yolanda Meadows was receptive to today's session as evidenced by openness to sharing, responsiveness to feedback, and willingness to explore triggers for emotional eating.  Mental Status Examination:  Appearance: neat Behavior: cooperative Mood: euthymic Affect: mood congruent Speech: normal in rate, volume, and tone Eye Contact: appropriate Psychomotor Activity: appropriate Thought Process: linear, logical, and goal directed  Content/Perceptual Disturbances: no hallucinations, delusions, bizarre thinking or behavior reported or observed and no evidence of suicidal and homicidal ideation, plan, and intent Orientation: time, person, place and purpose of appointment Cognition/Sensorium: memory, attention, language, and fund of knowledge intact  Insight: fair Judgment: fair   Structured Assessment Results: The Patient Health Questionnaire-9 (PHQ-9) is a self-report measure that assesses symptoms and severity of depression over the course of the last two weeks. Yolanda Meadows obtained a score of 5 suggesting mild depression. Yolanda Meadows finds the endorsed symptoms to be not difficult at all. Little interest or pleasure in doing things 0  Feeling down, depressed, or hopeless 0  Trouble falling or staying asleep, or sleeping too much 0  Feeling tired or having little energy 3  Poor appetite or overeating 1  Feeling bad about yourself --- or that you are a failure or have let  yourself or your family down 1  Trouble concentrating on things, such as reading the newspaper or watching television 0  Moving or  speaking so slowly that other people could have noticed? Or the opposite --- being so fidgety or restless that you have been moving around a lot more than usual 0  Thoughts that you would be better off dead or hurting yourself in some way 0  PHQ-9 Score 5    The Generalized Anxiety Disorder-7 (GAD-7) is a brief self-report measure that assesses symptoms of anxiety over the course of the last two weeks. Yolanda Meadows obtained a score of 6 suggesting mild anxiety. Yolanda Meadows finds the endorsed symptoms to be very difficult. Feeling nervous, anxious, on edge 0  Not being able to stop or control worrying 0  Worrying too much about different things 0  Trouble relaxing 0  Being so restless that it's hard to sit still 0  Becoming easily annoyed or irritable 3  Feeling afraid as if something awful might happen- due to cockroaches and snakes during summer 3  GAD-7 Score 6   Interventions:  Conducted a brief chart review Verbal administration of PHQ-9 and GAD-7 for symptom monitoring Provided empathic reflections and validation Psychoeducation provided regarding triggers for emotional eating Provided positive reinforcement Focused on rapport building Psychoeducation provided regarding all-or-nothing thinking Employed supportive psychotherapy interventions to facilitate reduced distress, and to improve coping skills with identified stressors  DSM-5 Diagnosis: 311 (F32.8) Other Specified Depressive Disorder, Emotional Eating Behaviors  Treatment Goal & Progress: During the initial appointment with this provider, the following treatment goal was established: decrease emotional eating. Progress is limited, as Shailyn has just begun treatment with this provider; however, she is receptive to the interaction and interventions and rapport is being established.   Plan: Yolanda Meadows continues to  appear able and willing to participate as evidenced by engagement in reciprocal conversation, and asking questions for clarification as appropriate. Based on Yolanda Meadows's work schedule and her request for a late afternoon appointment, the next appointment will be scheduled in 3-4 weeks, which will be via [employer]. The next session will focus on reviewing triggers for emotional eating and the introduction of mindfulness.

## 2019-01-23 ENCOUNTER — Ambulatory Visit (INDEPENDENT_AMBULATORY_CARE_PROVIDER_SITE_OTHER): Payer: BC Managed Care – PPO | Admitting: Psychology

## 2019-01-23 ENCOUNTER — Encounter (INDEPENDENT_AMBULATORY_CARE_PROVIDER_SITE_OTHER): Payer: Self-pay

## 2019-01-23 ENCOUNTER — Other Ambulatory Visit: Payer: Self-pay

## 2019-01-23 DIAGNOSIS — F3289 Other specified depressive episodes: Secondary | ICD-10-CM

## 2019-01-24 ENCOUNTER — Telehealth (INDEPENDENT_AMBULATORY_CARE_PROVIDER_SITE_OTHER): Payer: BC Managed Care – PPO | Admitting: Family Medicine

## 2019-01-26 ENCOUNTER — Encounter (INDEPENDENT_AMBULATORY_CARE_PROVIDER_SITE_OTHER): Payer: Self-pay | Admitting: Family Medicine

## 2019-01-28 ENCOUNTER — Telehealth (INDEPENDENT_AMBULATORY_CARE_PROVIDER_SITE_OTHER): Payer: BC Managed Care – PPO | Admitting: Family Medicine

## 2019-01-28 NOTE — Telephone Encounter (Signed)
Please advise 

## 2019-02-13 NOTE — Progress Notes (Signed)
Office: [phone]  /  Fax: [phone]    Date: February 19, 2019   Appointment Start Time: 4:30pm Duration: 30 minutes Provider: Glennie Isle, Psy.D. Type of Session: Individual Therapy  Location of Patient: Home Location of Provider: Provider's Home Type of Contact: Telepsychological Visit via [employer] Web[employer]   Session Content: Yolanda Meadows is a 33 y.o. female presenting via [place] for a follow-up appointment to address the previously established treatment goal of decreasing emotional eating. Today's appointment was a telepsychological visit, as this provider's clinic is seeing a limited number of patients for in-person visits due to COVID-19. Therapeutic services will resume to in-person appointments once deemed appropriate. Yolanda Meadows expressed understanding regarding the rationale for telepsychological services, and provided verbal consent for today's appointment. Prior to proceeding with today's appointment, Yolanda Meadows's physical location at the time of this appointment was obtained. Yolanda Meadows reported she was at home and provided the address. In the event of technical difficulties, Yolanda Meadows shared a phone number she could be reached at. Yolanda Meadows and this provider participated in today's telepsychological service. Also, Yolanda Meadows denied anyone else being present in the room or on the WebEx appointment.  This provider conducted a brief check-in and verbally administered the PHQ-9 and GAD-7. Rosealie acknowledged weighing herself weighing herself "every other day." Consequences of weighing regularly at home was discussed. A goal to reduce weighing herself was established. More specifically, Yolanda Meadows stated she is comfortable with weighing herself twice a week. Regarding her triggers for emotional eating, Yolanda Meadows noted boredom was a trigger recently, but she described making a better choice. This was positively reinforced In the past, she identified unpleasant emotions has triggered emotional eating. Moreover,  psychoeducation regarding mindfulness was provided to assist with coping. A handout was provided to [place] with further information regarding mindfulness, including exercises. This provider also explained the benefit of mindfulness as it relates to emotional eating. Yolanda Meadows was encouraged to engage in the provided exercises between now and the next appointment with this provider. Yolanda Meadows agreed. She was led through an exercise involving her senses. Yolanda Meadows provided verbal consent during today's appointment for this provider to send the handout about mindfulness via e-mail. Notably, Yolanda Meadows reported a significant a reduction in emotional eating since the onset of treatment with this provider; therefore, frequency of appointments will be reduced. Yolanda Meadows was receptive to today's session as evidenced by openness to sharing, responsiveness to feedback, and willingness to engage in mindfulness exercises.   Mental Status Examination:  Appearance: neat Behavior: cooperative Mood: euthymic Affect: mood congruent Speech: normal in rate, volume, and tone Eye Contact: appropriate Psychomotor Activity: appropriate Thought Process: linear, logical, and goal directed  Content/Perceptual Disturbances: no hallucinations, delusions, bizarre thinking or behavior reported or observed and no evidence of suicidal and homicidal ideation, plan, and intent Orientation: time, person, place and purpose of appointment Cognition/Sensorium: memory, attention, language, and fund of knowledge intact  Insight: good Judgment: good  Structured Assessment Results: The Patient Health Questionnaire-9 (PHQ-9) is a self-report measure that assesses symptoms and severity of depression over the course of the last two weeks. Yolanda Meadows obtained a score of 4 suggesting minimal depression. Yolanda Meadows finds the endorsed symptoms to be somewhat difficult. Little interest or pleasure in doing things 0  Feeling down, depressed, or hopeless 0  Trouble  falling or staying asleep, or sleeping too much 0  Feeling tired or having little energy 3  Poor appetite or overeating 1  Feeling bad about yourself --- or that you are a failure or have let yourself or your  family down 0  Trouble concentrating on things, such as reading the newspaper or watching television 0  Moving or speaking so slowly that other people could have noticed? Or the opposite --- being so fidgety or restless that you have been moving around a lot more than usual 0  Thoughts that you would be better off dead or hurting yourself in some way 0  PHQ-9 Score 4    The Generalized Anxiety Disorder-7 (GAD-7) is a brief self-report measure that assesses symptoms of anxiety over the course of the last two weeks. Yolanda Meadows obtained a score of 4 suggesting minimal anxiety. Yolanda Meadows finds the endorsed symptoms to be somewhat difficult. Feeling nervous, anxious, on edge 0  Not being able to stop or control worrying 1  Worrying too much about different things 1  Trouble relaxing 0  Being so restless that it's hard to sit still 0  Becoming easily annoyed or irritable 1  Feeling afraid as if something awful might happen- Yolanda Meadows 1  GAD-7 Score 4   Interventions:  Conducted a brief chart review Verbal administration of PHQ-9 and GAD-7 for symptom monitoring Provided empathic reflections and validation Reviewed content from the previous session Psychoeducation provided regarding mindfulness Engaged patient in a mindfulness exercise Provided positive reinforcement Employed supportive psychotherapy interventions to facilitate reduced distress, and to improve coping skills with identified stressors Employed acceptance and commitment interventions to emphasize mindfulness and acceptance without struggle  DSM-5 Diagnosis: 311 (F32.8) Other Specified Depressive Disorder, Emotional Eating Behaviors  Treatment Goal & Progress: During the initial appointment with this provider, the following  treatment goal was established: decrease emotional eating. [name] has demonstrated progress in her goal as evidenced by increased awareness of hunger patterns and triggers for emotional eating. Deshaun noted a reduction in emotional eating and demonstrated willingness to engage in mindfulness exercises.   Plan: Ireri continues to appear able and willing to participate as evidenced by engagement in reciprocal conversation, and asking questions for clarification as appropriate. The next appointment will be scheduled in one month, which will be via [employer]. The next session will focus further on mindfulness.

## 2019-02-19 ENCOUNTER — Ambulatory Visit (INDEPENDENT_AMBULATORY_CARE_PROVIDER_SITE_OTHER): Payer: BC Managed Care – PPO | Admitting: Psychology

## 2019-02-19 DIAGNOSIS — F3289 Other specified depressive episodes: Secondary | ICD-10-CM | POA: Diagnosis not present

## 2019-02-21 ENCOUNTER — Other Ambulatory Visit: Payer: Self-pay

## 2019-02-21 ENCOUNTER — Other Ambulatory Visit (INDEPENDENT_AMBULATORY_CARE_PROVIDER_SITE_OTHER): Payer: Self-pay | Admitting: Family Medicine

## 2019-02-21 ENCOUNTER — Telehealth (INDEPENDENT_AMBULATORY_CARE_PROVIDER_SITE_OTHER): Payer: Self-pay | Admitting: Family Medicine

## 2019-02-21 DIAGNOSIS — E559 Vitamin D deficiency, unspecified: Secondary | ICD-10-CM

## 2019-02-21 NOTE — Telephone Encounter (Signed)
Pt called and said she received a message her Vit D was denied.  She would like to get this refilled or know if she should start taking over the counter.  Please let the patient know.  [facility] on [address] in [place].

## 2019-02-22 ENCOUNTER — Encounter (INDEPENDENT_AMBULATORY_CARE_PROVIDER_SITE_OTHER): Payer: Self-pay | Admitting: Family Medicine

## 2019-03-05 ENCOUNTER — Ambulatory Visit (INDEPENDENT_AMBULATORY_CARE_PROVIDER_SITE_OTHER): Payer: BC Managed Care – PPO | Admitting: Family Medicine

## 2019-03-19 ENCOUNTER — Ambulatory Visit (INDEPENDENT_AMBULATORY_CARE_PROVIDER_SITE_OTHER): Payer: BC Managed Care – PPO | Admitting: Psychology

## 2022-01-19 ENCOUNTER — Encounter (INDEPENDENT_AMBULATORY_CARE_PROVIDER_SITE_OTHER): Payer: Self-pay
